# Patient Record
Sex: Male | Born: 1935 | Race: White | Hispanic: No | Marital: Married | Smoking: Former smoker
Health system: Southern US, Community
[De-identification: ages and names within clinical notes are randomized; demographics above are authoritative.]

## PROBLEM LIST (undated history)

## (undated) DIAGNOSIS — N400 Enlarged prostate without lower urinary tract symptoms: Secondary | ICD-10-CM

## (undated) DIAGNOSIS — Z9181 History of falling: Secondary | ICD-10-CM

## (undated) DIAGNOSIS — I739 Peripheral vascular disease, unspecified: Secondary | ICD-10-CM

## (undated) DIAGNOSIS — G629 Polyneuropathy, unspecified: Secondary | ICD-10-CM

## (undated) DIAGNOSIS — E876 Hypokalemia: Secondary | ICD-10-CM

## (undated) DIAGNOSIS — T8859XA Other complications of anesthesia, initial encounter: Secondary | ICD-10-CM

## (undated) DIAGNOSIS — R32 Unspecified urinary incontinence: Secondary | ICD-10-CM

## (undated) DIAGNOSIS — G319 Degenerative disease of nervous system, unspecified: Secondary | ICD-10-CM

## (undated) DIAGNOSIS — M1710 Unilateral primary osteoarthritis, unspecified knee: Secondary | ICD-10-CM

## (undated) DIAGNOSIS — D649 Anemia, unspecified: Secondary | ICD-10-CM

## (undated) DIAGNOSIS — M159 Polyosteoarthritis, unspecified: Secondary | ICD-10-CM

## (undated) DIAGNOSIS — M419 Scoliosis, unspecified: Secondary | ICD-10-CM

## (undated) DIAGNOSIS — K5790 Diverticulosis of intestine, part unspecified, without perforation or abscess without bleeding: Secondary | ICD-10-CM

## (undated) DIAGNOSIS — I517 Cardiomegaly: Secondary | ICD-10-CM

## (undated) DIAGNOSIS — IMO0001 Reserved for inherently not codable concepts without codable children: Secondary | ICD-10-CM

## (undated) DIAGNOSIS — G309 Alzheimer's disease, unspecified: Principal | ICD-10-CM

## (undated) DIAGNOSIS — R2689 Other abnormalities of gait and mobility: Secondary | ICD-10-CM

## (undated) DIAGNOSIS — E785 Hyperlipidemia, unspecified: Secondary | ICD-10-CM

## (undated) DIAGNOSIS — F0280 Dementia in other diseases classified elsewhere without behavioral disturbance: Secondary | ICD-10-CM

## (undated) DIAGNOSIS — D696 Thrombocytopenia, unspecified: Secondary | ICD-10-CM

## (undated) DIAGNOSIS — I712 Thoracic aortic aneurysm, without rupture, unspecified: Secondary | ICD-10-CM

## (undated) DIAGNOSIS — I679 Cerebrovascular disease, unspecified: Secondary | ICD-10-CM

## (undated) DIAGNOSIS — E871 Hypo-osmolality and hyponatremia: Secondary | ICD-10-CM

## (undated) DIAGNOSIS — K219 Gastro-esophageal reflux disease without esophagitis: Secondary | ICD-10-CM

## (undated) DIAGNOSIS — T4145XA Adverse effect of unspecified anesthetic, initial encounter: Secondary | ICD-10-CM

## (undated) DIAGNOSIS — I499 Cardiac arrhythmia, unspecified: Secondary | ICD-10-CM

## (undated) DIAGNOSIS — J189 Pneumonia, unspecified organism: Secondary | ICD-10-CM

## (undated) DIAGNOSIS — K922 Gastrointestinal hemorrhage, unspecified: Secondary | ICD-10-CM

## (undated) DIAGNOSIS — E46 Unspecified protein-calorie malnutrition: Secondary | ICD-10-CM

## (undated) DIAGNOSIS — H9190 Unspecified hearing loss, unspecified ear: Secondary | ICD-10-CM

## (undated) DIAGNOSIS — I4891 Unspecified atrial fibrillation: Secondary | ICD-10-CM

## (undated) HISTORY — DX: Scoliosis, unspecified: M41.9

## (undated) HISTORY — DX: Cerebrovascular disease, unspecified: I67.9

## (undated) HISTORY — DX: History of falling: Z91.81

## (undated) HISTORY — PX: JOINT REPLACEMENT: SHX530

## (undated) HISTORY — DX: Thrombocytopenia, unspecified: D69.6

## (undated) HISTORY — DX: Hypokalemia: E87.6

## (undated) HISTORY — DX: Degenerative disease of nervous system, unspecified: G31.9

## (undated) HISTORY — DX: Unilateral primary osteoarthritis, unspecified knee: M17.10

## (undated) HISTORY — DX: Thoracic aortic aneurysm, without rupture: I71.2

## (undated) HISTORY — PX: HERNIA REPAIR: SHX51

## (undated) HISTORY — DX: Hypo-osmolality and hyponatremia: E87.1

## (undated) HISTORY — PX: OTHER SURGICAL HISTORY: SHX169

## (undated) HISTORY — DX: Thoracic aortic aneurysm, without rupture, unspecified: I71.20

## (undated) HISTORY — DX: Alzheimer's disease, unspecified: G30.9

## (undated) HISTORY — DX: Gastro-esophageal reflux disease without esophagitis: K21.9

## (undated) HISTORY — DX: Benign prostatic hyperplasia without lower urinary tract symptoms: N40.0

## (undated) HISTORY — DX: Polyosteoarthritis, unspecified: M15.9

## (undated) HISTORY — DX: Unspecified protein-calorie malnutrition: E46

## (undated) HISTORY — DX: Hyperlipidemia, unspecified: E78.5

## (undated) HISTORY — DX: Unspecified atrial fibrillation: I48.91

## (undated) HISTORY — DX: Cardiomegaly: I51.7

## (undated) HISTORY — DX: Dementia in other diseases classified elsewhere, unspecified severity, without behavioral disturbance, psychotic disturbance, mood disturbance, and anxiety: F02.80

---
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
## 1998-02-10 ENCOUNTER — Ambulatory Visit (HOSPITAL_COMMUNITY): Admission: RE | Admit: 1998-02-10 | Discharge: 1998-02-10 | Payer: Self-pay | Admitting: Internal Medicine

## 2006-08-09 HISTORY — PX: OTHER SURGICAL HISTORY: SHX169

## 2006-08-09 HISTORY — PX: VIDEO ASSISTED THORACOSCOPY (VATS)/DECORTICATION: SHX6171

## 2006-12-04 ENCOUNTER — Ambulatory Visit: Payer: Self-pay | Admitting: Cardiology

## 2006-12-04 ENCOUNTER — Inpatient Hospital Stay (HOSPITAL_COMMUNITY): Admission: EM | Admit: 2006-12-04 | Discharge: 2006-12-09 | Payer: Self-pay | Admitting: Emergency Medicine

## 2006-12-05 ENCOUNTER — Ambulatory Visit: Payer: Self-pay | Admitting: Cardiology

## 2006-12-05 ENCOUNTER — Encounter: Payer: Self-pay | Admitting: Cardiology

## 2006-12-15 ENCOUNTER — Encounter: Payer: Self-pay | Admitting: Emergency Medicine

## 2006-12-16 ENCOUNTER — Ambulatory Visit: Payer: Self-pay | Admitting: Pulmonary Disease

## 2006-12-16 ENCOUNTER — Encounter (INDEPENDENT_AMBULATORY_CARE_PROVIDER_SITE_OTHER): Payer: Self-pay | Admitting: Specialist

## 2006-12-16 ENCOUNTER — Inpatient Hospital Stay (HOSPITAL_COMMUNITY): Admission: EM | Admit: 2006-12-16 | Discharge: 2007-01-04 | Payer: Self-pay | Admitting: Internal Medicine

## 2006-12-16 ENCOUNTER — Ambulatory Visit: Payer: Self-pay | Admitting: Cardiothoracic Surgery

## 2006-12-16 ENCOUNTER — Ambulatory Visit: Payer: Self-pay | Admitting: Internal Medicine

## 2006-12-23 ENCOUNTER — Ambulatory Visit: Payer: Self-pay | Admitting: Physical Medicine & Rehabilitation

## 2007-01-03 ENCOUNTER — Ambulatory Visit: Payer: Self-pay | Admitting: Gastroenterology

## 2007-01-04 ENCOUNTER — Inpatient Hospital Stay (HOSPITAL_COMMUNITY)
Admission: RE | Admit: 2007-01-04 | Discharge: 2007-01-17 | Payer: Self-pay | Admitting: Physical Medicine & Rehabilitation

## 2007-02-02 ENCOUNTER — Ambulatory Visit (HOSPITAL_COMMUNITY)
Admission: RE | Admit: 2007-02-02 | Discharge: 2007-02-02 | Payer: Self-pay | Admitting: Physical Medicine & Rehabilitation

## 2007-02-02 ENCOUNTER — Ambulatory Visit
Admission: RE | Admit: 2007-02-02 | Discharge: 2007-02-02 | Payer: Self-pay | Admitting: Physical Medicine & Rehabilitation

## 2007-03-01 ENCOUNTER — Encounter: Admission: RE | Admit: 2007-03-01 | Discharge: 2007-03-28 | Payer: Self-pay | Admitting: Internal Medicine

## 2007-03-20 ENCOUNTER — Ambulatory Visit: Payer: Self-pay | Admitting: Gastroenterology

## 2007-03-29 ENCOUNTER — Ambulatory Visit: Payer: Self-pay | Admitting: Gastroenterology

## 2007-04-18 ENCOUNTER — Ambulatory Visit: Payer: Self-pay | Admitting: Gastroenterology

## 2007-10-26 DIAGNOSIS — K4090 Unilateral inguinal hernia, without obstruction or gangrene, not specified as recurrent: Secondary | ICD-10-CM | POA: Insufficient documentation

## 2007-10-26 DIAGNOSIS — M159 Polyosteoarthritis, unspecified: Secondary | ICD-10-CM

## 2007-10-26 DIAGNOSIS — I4891 Unspecified atrial fibrillation: Secondary | ICD-10-CM | POA: Insufficient documentation

## 2007-10-26 DIAGNOSIS — J869 Pyothorax without fistula: Secondary | ICD-10-CM | POA: Insufficient documentation

## 2007-10-26 DIAGNOSIS — B965 Pseudomonas (aeruginosa) (mallei) (pseudomallei) as the cause of diseases classified elsewhere: Secondary | ICD-10-CM

## 2007-10-26 HISTORY — DX: Polyosteoarthritis, unspecified: M15.9

## 2008-10-21 IMAGING — CR DG CHEST 1V PORT
1 series · 1 of 1 positions shown · non-contrast
Comparison: None

CLINICAL DATA: Atrial fibrillation. Chest pain.

PORTABLE CHEST - 1 VIEW

[view not recorded]
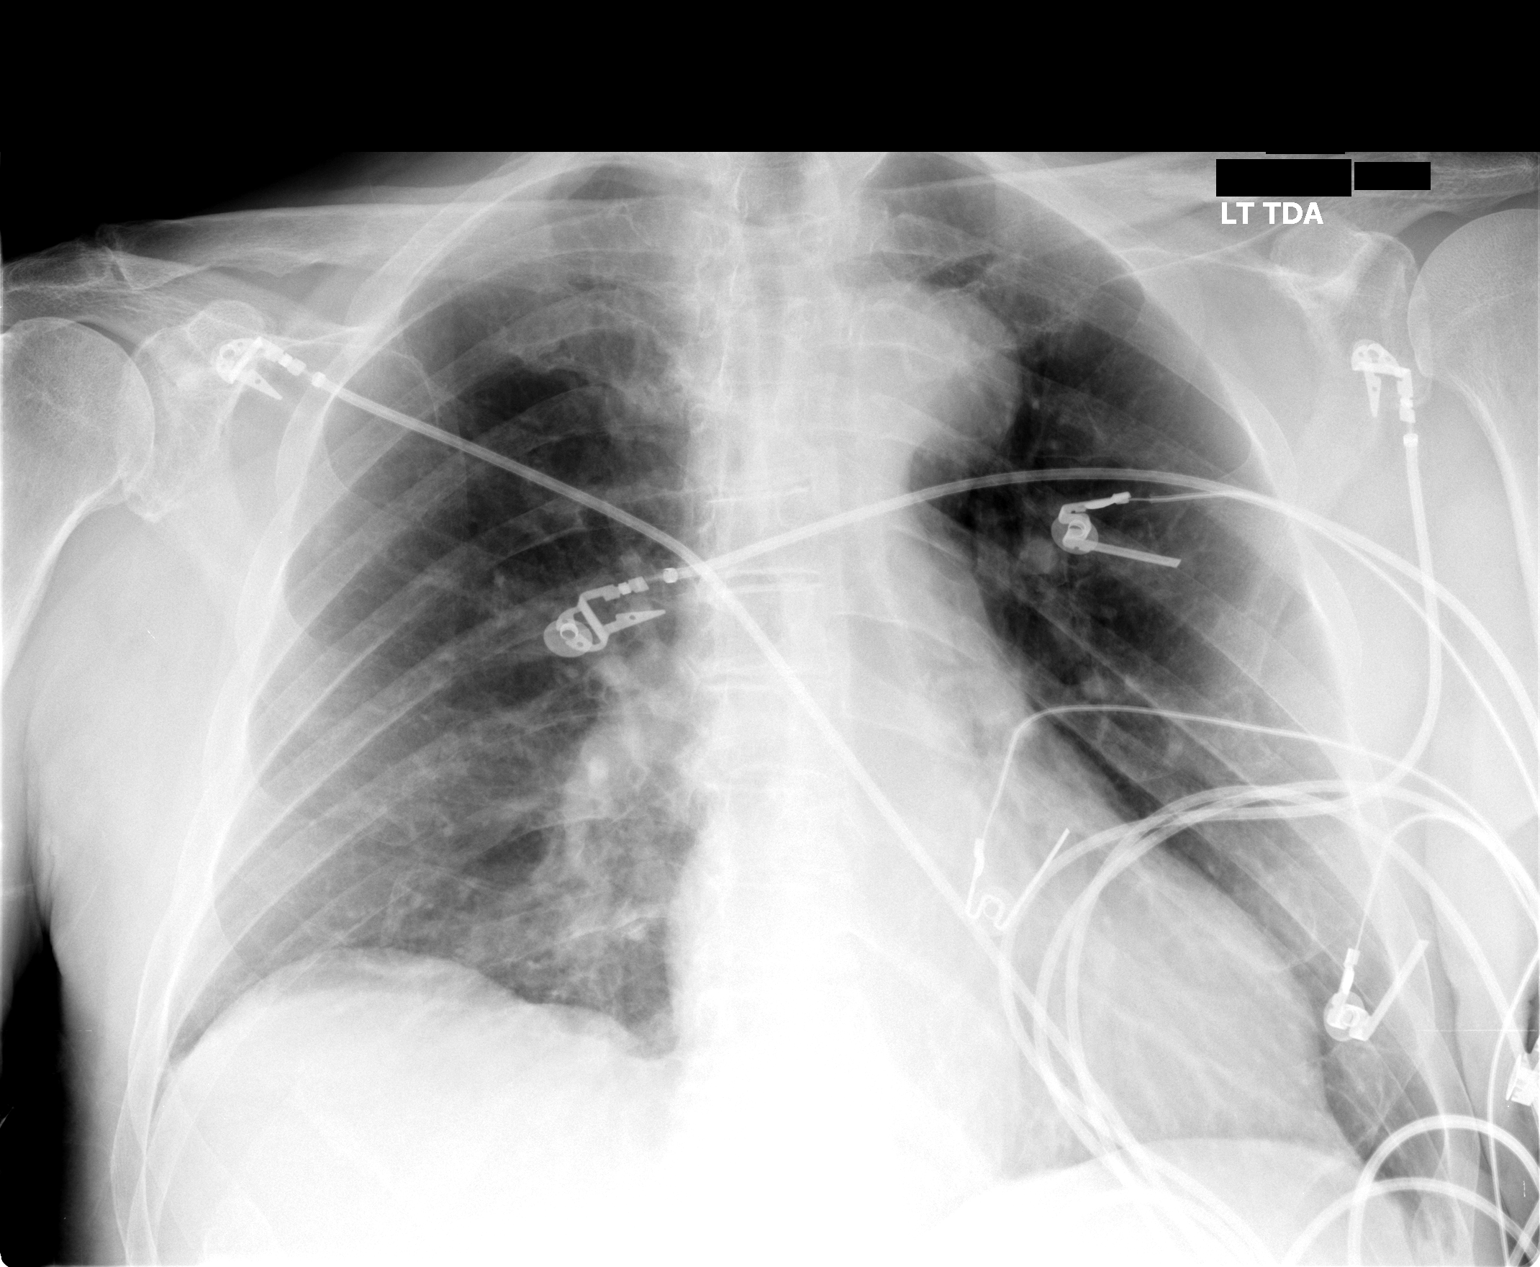

[1 of 1 positions shown; findings below may reference images not displayed]

FINDINGS: The aortic arch is high in position, although does not qualify as a
"cervical aortic arch."

Heart size is within normal limits. The lungs appear clear. No edema is noted.  

IMPRESSION

No acute thoracic findings.

## 2008-10-23 IMAGING — CT CT HEAD WO/W CM
3 series · 18 of 30 positions shown, 20 images · IV contrast (100 ML OMNI 300)
Comparison: None.

CLINICAL DATA: Atrial fibrillation. Confusion. 
 HEAD CT WITHOUT AND WITH CONTRAST:
TECHNIQUE: Contiguous axial images were obtained from the base of the skull through the vertex according to standard protocol before and after administration of intravenous contrast.
 Contrast:  100 ml Omnipaque 300

[Series 2: brain · axial · 0.47mm/px · z∈[+153,+279]mm · 8 of 32 slices shown, 10 images (1 of 2)]
[im 4/32  brain]
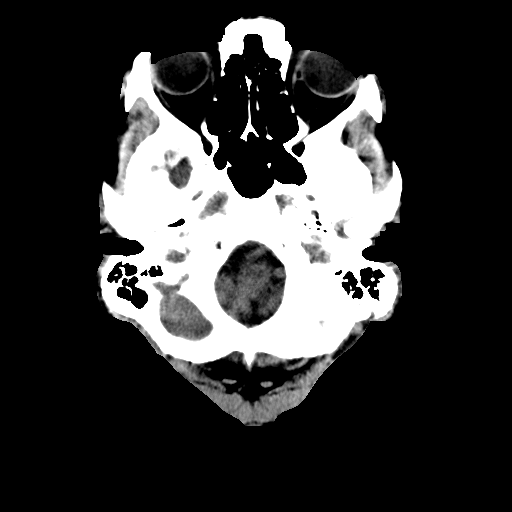
[im 4/32  bone]
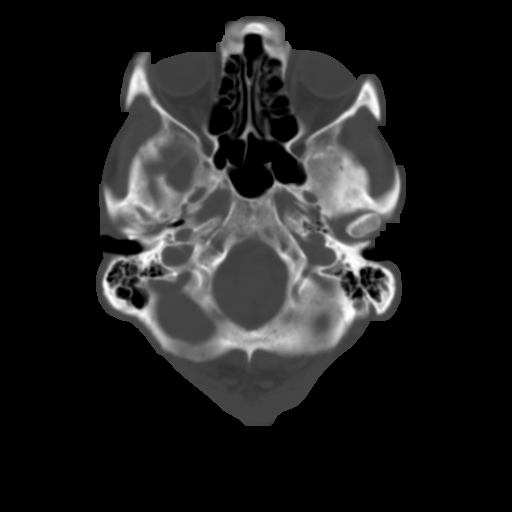
[im 7/32  brain]
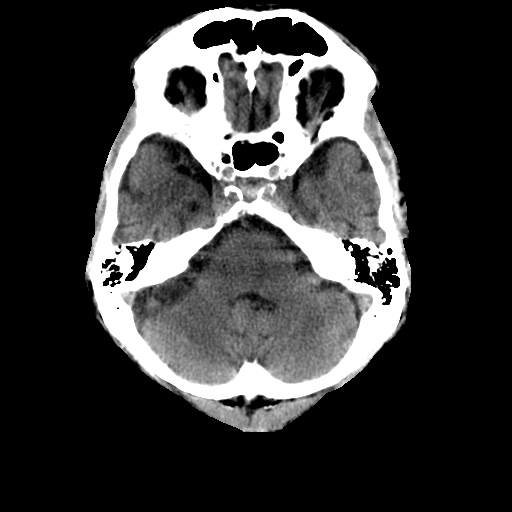
[im 11/32  brain]
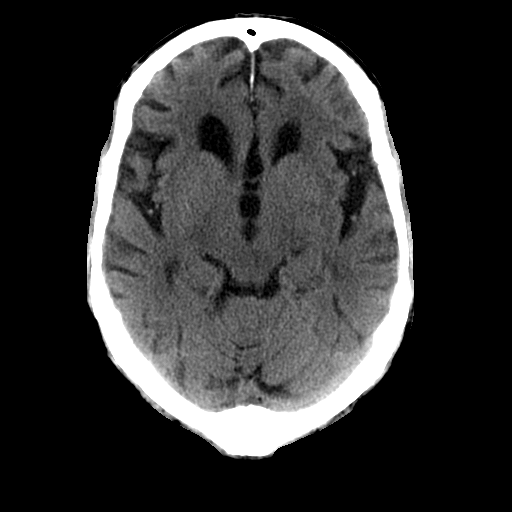
[im 14/32  brain]
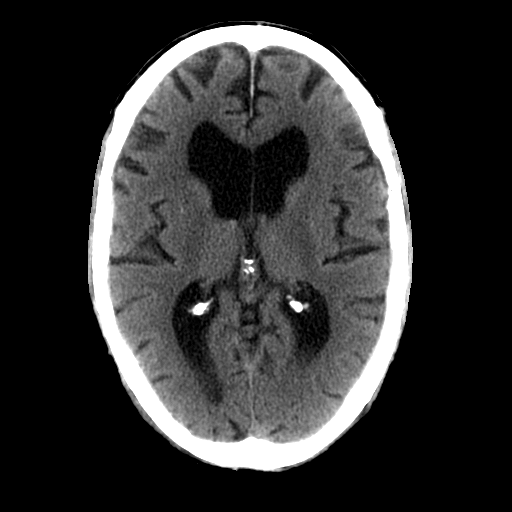
[im 18/32  brain]
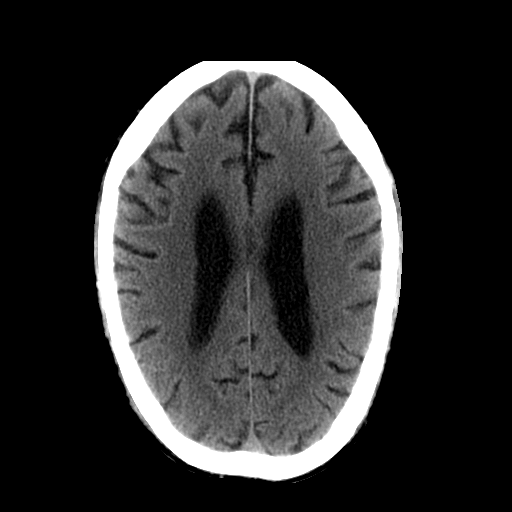
[im 18/32  bone]
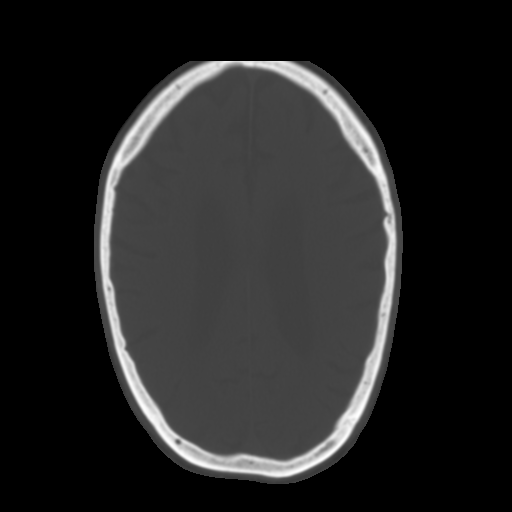
[im 21/32  brain]
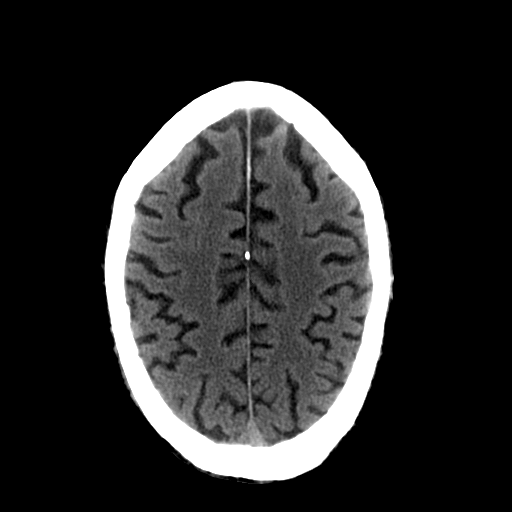
[im 25/32  brain]
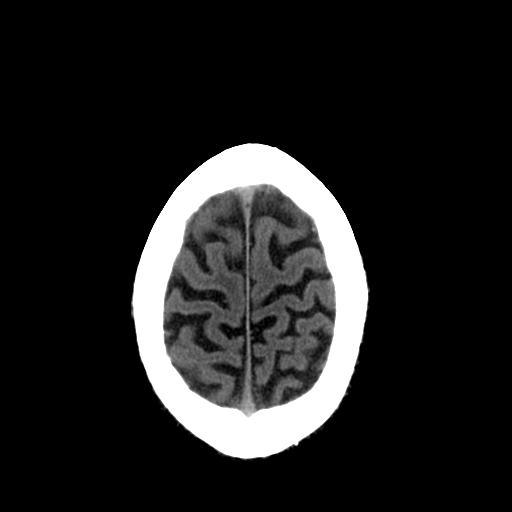
[im 28/32  brain]
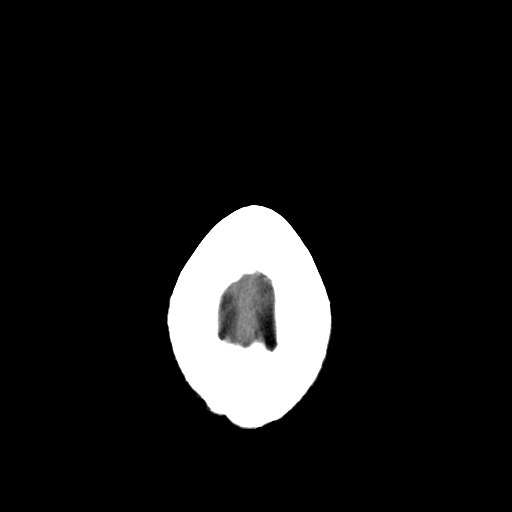

[Series 3: brain · axial · 0.47mm/px · z∈[+153,+279]mm · 8 of 32 slices shown (2 of 2)]
[im 4/32  brain]
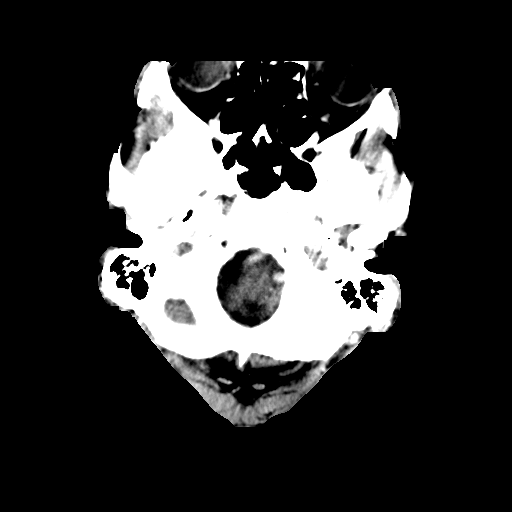
[im 7/32  brain]
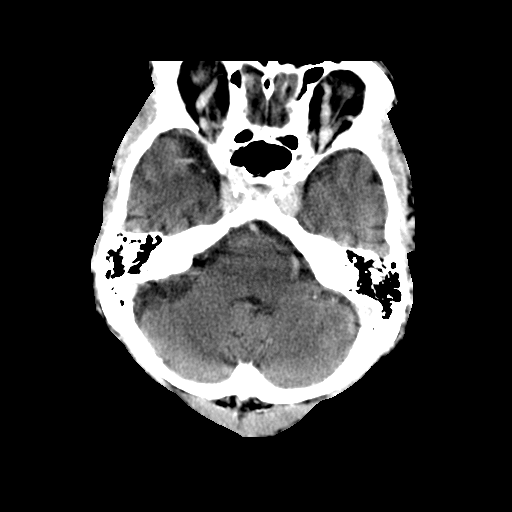
[im 11/32  brain]
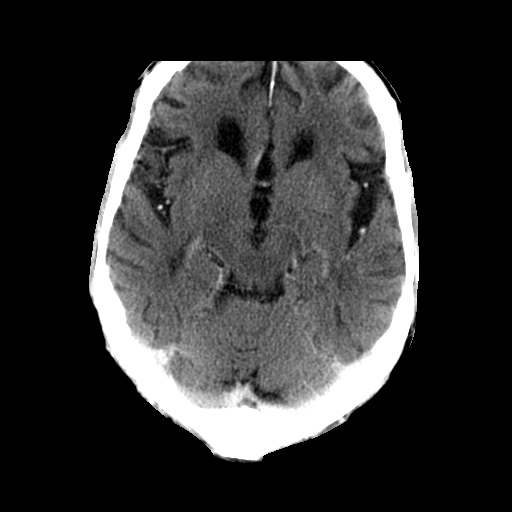
[im 14/32  brain]
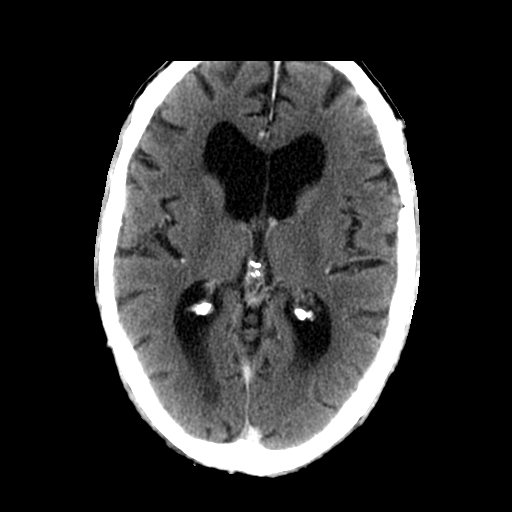
[im 18/32  brain]
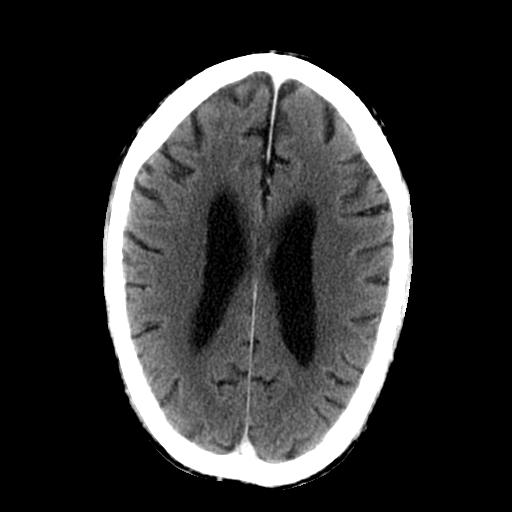
[im 21/32  brain]
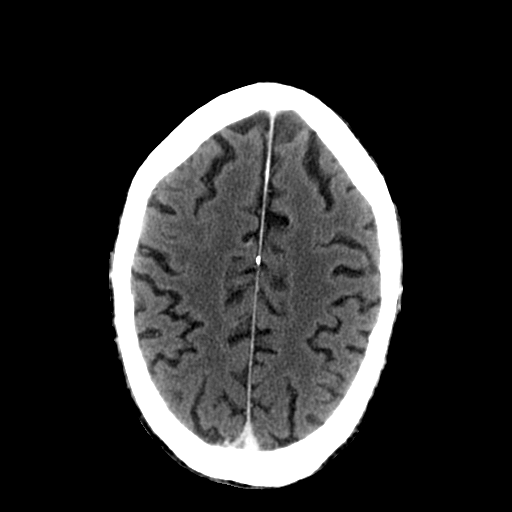
[im 25/32  brain]
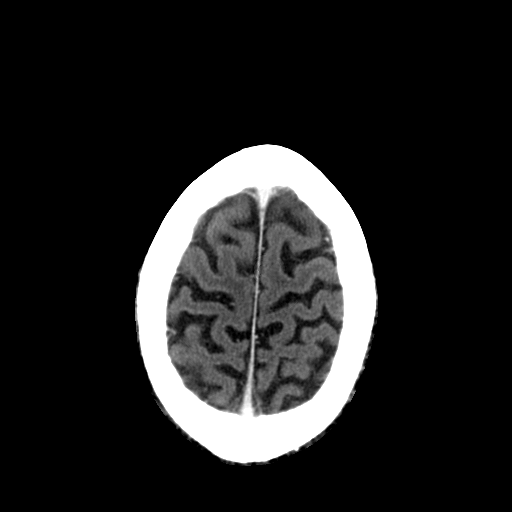
[im 28/32  brain]
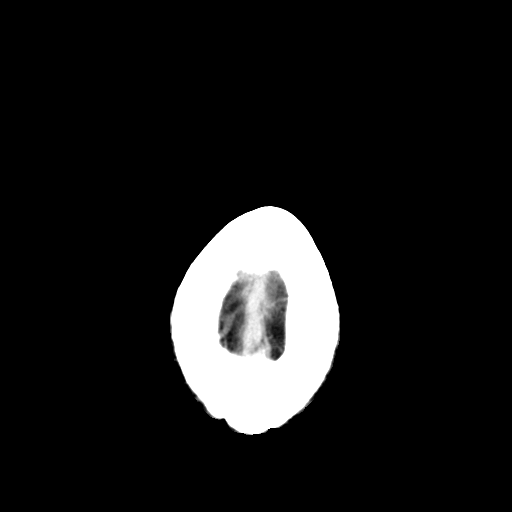

[Series 4: recon 2: brain · axial · 0.47mm/px · z∈[+153,+169]mm · 2 of 32 slices shown]
[im 4/32  brain]
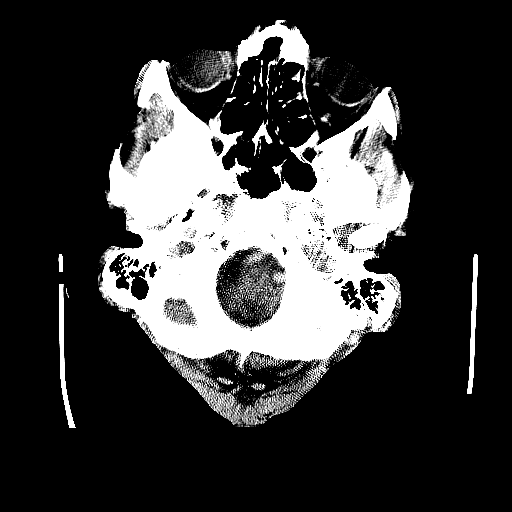
[im 7/32  brain]
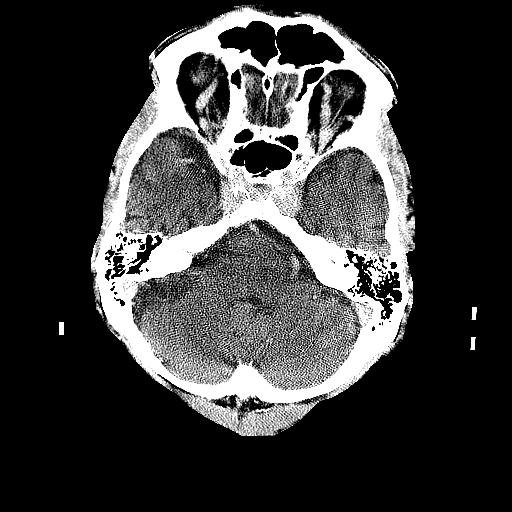

[18 of 30 positions shown; findings below may reference images not displayed]

FINDINGS: There is generalized atrophy.  There is no hemorrhage, mass, or acute infarct.  Enhancement pattern is normal and there is no mass lesion.
IMPRESSION: Generalized atrophy. No acute abnormality.

## 2010-08-09 DIAGNOSIS — K922 Gastrointestinal hemorrhage, unspecified: Secondary | ICD-10-CM

## 2010-08-09 HISTORY — DX: Gastrointestinal hemorrhage, unspecified: K92.2

## 2010-08-30 ENCOUNTER — Encounter: Payer: Self-pay | Admitting: Physical Medicine & Rehabilitation

## 2010-09-29 ENCOUNTER — Other Ambulatory Visit: Payer: Self-pay | Admitting: Orthopedic Surgery

## 2010-09-29 ENCOUNTER — Encounter (HOSPITAL_COMMUNITY): Payer: Medicare Other

## 2010-09-29 ENCOUNTER — Other Ambulatory Visit (HOSPITAL_COMMUNITY): Payer: Self-pay | Admitting: Orthopedic Surgery

## 2010-09-29 ENCOUNTER — Ambulatory Visit (HOSPITAL_COMMUNITY)
Admission: RE | Admit: 2010-09-29 | Discharge: 2010-09-29 | Disposition: A | Discharge status: Home / Self Care | Payer: Medicare Other | Source: Ambulatory Visit | Attending: Orthopedic Surgery | Admitting: Orthopedic Surgery

## 2010-09-29 DIAGNOSIS — Z01818 Encounter for other preprocedural examination: Secondary | ICD-10-CM | POA: Insufficient documentation

## 2010-09-29 DIAGNOSIS — M1710 Unilateral primary osteoarthritis, unspecified knee: Secondary | ICD-10-CM

## 2010-09-29 DIAGNOSIS — Z01812 Encounter for preprocedural laboratory examination: Secondary | ICD-10-CM | POA: Insufficient documentation

## 2010-09-29 LAB — URINALYSIS, ROUTINE W REFLEX MICROSCOPIC
Bilirubin Urine: NEGATIVE
Hgb urine dipstick: NEGATIVE
Nitrite: NEGATIVE
Protein, ur: NEGATIVE mg/dL
Specific Gravity, Urine: 1.018 (ref 1.005–1.030)
Urine Glucose, Fasting: NEGATIVE mg/dL
Urobilinogen, UA: 1 mg/dL (ref 0.0–1.0)
pH: 6.5 (ref 5.0–8.0)

## 2010-09-29 LAB — CBC
HCT: 39.8 % (ref 39.0–52.0)
Hemoglobin: 12.9 g/dL — ABNORMAL LOW (ref 13.0–17.0)
MCH: 30.7 pg (ref 26.0–34.0)
MCHC: 32.4 g/dL (ref 30.0–36.0)
MCV: 94.8 fL (ref 78.0–100.0)
Platelets: 193 K/uL (ref 150–400)
RBC: 4.2 MIL/uL — ABNORMAL LOW (ref 4.22–5.81)
RDW: 12.8 % (ref 11.5–15.5)
WBC: 8.3 K/uL (ref 4.0–10.5)

## 2010-09-29 LAB — COMPREHENSIVE METABOLIC PANEL WITH GFR
ALT: 16 U/L (ref 0–53)
AST: 30 U/L (ref 0–37)
Albumin: 3.6 g/dL (ref 3.5–5.2)
Alkaline Phosphatase: 70 U/L (ref 39–117)
BUN: 21 mg/dL (ref 6–23)
CO2: 28 meq/L (ref 19–32)
Calcium: 9.4 mg/dL (ref 8.4–10.5)
Chloride: 105 meq/L (ref 96–112)
Creatinine, Ser: 0.97 mg/dL (ref 0.4–1.5)
GFR calc non Af Amer: >60 mL/min
Glucose, Bld: 96 mg/dL (ref 70–99)
Potassium: 4.8 meq/L (ref 3.5–5.1)
Sodium: 140 meq/L (ref 135–145)
Total Bilirubin: 0.9 mg/dL (ref 0.3–1.2)
Total Protein: 6.3 g/dL (ref 6.0–8.3)

## 2010-09-29 LAB — APTT

## 2010-10-05 ENCOUNTER — Inpatient Hospital Stay (HOSPITAL_COMMUNITY)
Admission: RE | Admit: 2010-10-05 | Discharge: 2010-10-09 | DRG: 470 | Disposition: A | Discharge status: Skilled Nursing Facility | Payer: Medicare Other | Source: Ambulatory Visit | Attending: Orthopedic Surgery | Admitting: Orthopedic Surgery

## 2010-10-05 DIAGNOSIS — E876 Hypokalemia: Secondary | ICD-10-CM | POA: Diagnosis not present

## 2010-10-05 DIAGNOSIS — E785 Hyperlipidemia, unspecified: Secondary | ICD-10-CM | POA: Diagnosis present

## 2010-10-05 DIAGNOSIS — H9190 Unspecified hearing loss, unspecified ear: Secondary | ICD-10-CM | POA: Diagnosis present

## 2010-10-05 DIAGNOSIS — D62 Acute posthemorrhagic anemia: Secondary | ICD-10-CM | POA: Diagnosis not present

## 2010-10-05 DIAGNOSIS — E871 Hypo-osmolality and hyponatremia: Secondary | ICD-10-CM | POA: Diagnosis not present

## 2010-10-05 DIAGNOSIS — M4120 Other idiopathic scoliosis, site unspecified: Secondary | ICD-10-CM | POA: Diagnosis present

## 2010-10-05 DIAGNOSIS — I4891 Unspecified atrial fibrillation: Secondary | ICD-10-CM | POA: Diagnosis present

## 2010-10-05 DIAGNOSIS — E039 Hypothyroidism, unspecified: Secondary | ICD-10-CM | POA: Diagnosis present

## 2010-10-05 DIAGNOSIS — G589 Mononeuropathy, unspecified: Secondary | ICD-10-CM | POA: Diagnosis present

## 2010-10-05 DIAGNOSIS — R351 Nocturia: Secondary | ICD-10-CM | POA: Diagnosis present

## 2010-10-05 DIAGNOSIS — K219 Gastro-esophageal reflux disease without esophagitis: Secondary | ICD-10-CM | POA: Diagnosis present

## 2010-10-05 DIAGNOSIS — M1710 Unilateral primary osteoarthritis, unspecified knee: Principal | ICD-10-CM | POA: Diagnosis present

## 2010-10-05 DIAGNOSIS — N400 Enlarged prostate without lower urinary tract symptoms: Secondary | ICD-10-CM | POA: Diagnosis present

## 2010-10-05 HISTORY — PX: TOTAL KNEE ARTHROPLASTY: SHX125

## 2010-10-05 LAB — TYPE AND SCREEN: Antibody Screen: NEGATIVE

## 2010-10-06 LAB — CBC
Hemoglobin: 9.5 g/dL — ABNORMAL LOW (ref 13.0–17.0)
MCH: 31.1 pg (ref 26.0–34.0)
RBC: 3.05 MIL/uL — ABNORMAL LOW (ref 4.22–5.81)
WBC: 9.5 10*3/uL (ref 4.0–10.5)

## 2010-10-06 LAB — BASIC METABOLIC PANEL
BUN: 11 mg/dL (ref 6–23)
Chloride: 99 mEq/L (ref 96–112)
Creatinine, Ser: 0.83 mg/dL (ref 0.4–1.5)
GFR calc non Af Amer: >60 mL/min (ref >60)

## 2010-10-06 LAB — PROTIME-INR: Prothrombin Time: 17.7 seconds — ABNORMAL HIGH (ref 11.6–15.2)

## 2010-10-07 LAB — PROTIME-INR: Prothrombin Time: 18.8 seconds — ABNORMAL HIGH (ref 11.6–15.2)

## 2010-10-07 LAB — CBC
Hemoglobin: 9.7 g/dL — ABNORMAL LOW (ref 13.0–17.0)
MCH: 31 pg (ref 26.0–34.0)
MCHC: 33.4 g/dL (ref 30.0–36.0)

## 2010-10-07 LAB — BASIC METABOLIC PANEL
BUN: 14 mg/dL (ref 6–23)
GFR calc non Af Amer: >60 mL/min (ref >60)
Glucose, Bld: 121 mg/dL — ABNORMAL HIGH (ref 70–99)
Potassium: 3.5 mEq/L (ref 3.5–5.1)

## 2010-10-08 LAB — CBC
HCT: 26.1 % — ABNORMAL LOW (ref 39.0–52.0)
MCH: 31.1 pg (ref 26.0–34.0)
MCHC: 33.7 g/dL (ref 30.0–36.0)
MCV: 92.2 fL (ref 78.0–100.0)
RDW: 12.5 % (ref 11.5–15.5)

## 2010-10-08 LAB — PROTIME-INR: INR: 1.44 (ref 0.00–1.49)

## 2010-10-15 NOTE — Op Note (Signed)
NAMEELBRIDGE, MAGOWAN                ACCOUNT NO.:  [id number]  MEDICAL RECORD NO.:  [id number]           PATIENT TYPE:  I  LOCATION:  0004                         FACILITY:  [facility]  PHYSICIAN:  Ollen Gross, M.D.    DATE OF BIRTH:  [birth year]  DATE OF PROCEDURE:  10/05/2010 DATE OF DISCHARGE:  09/29/2010                              OPERATIVE REPORT   PREOPERATIVE DIAGNOSIS:  Osteoarthritis, right knee.  POSTOPERATIVE DIAGNOSIS:  Osteoarthritis, right knee.  PROCEDURE:  Right total knee arthroplasty.  SURGEON:  Ollen Gross, M.D.  ASSISTANT:  Rozell Searing, [facility]  ANESTHESIA:  Spinal converted to general.  ESTIMATED BLOOD LOSS:  Minimal.  DRAIN:  Hemovac x1.  TOURNIQUET TIME:  45 minutes at 300 mmHg.  COMPLICATIONS:  None.  CONDITION.:  Stable to recovery room.  BRIEF CLINICAL NOTE:  Mr. Speciale is a 77 year old male with severe end- stage arthritis of the right knee with progressive worsening pain and dysfunction.  He is an avid exerciser, but it has been limiting his ability to exercise.  He has failed nonoperative management and presents now for right total knee arthroplasty.  PROCEDURE IN DETAIL:  After successful administration of spinal anesthetic, a tourniquet was placed high on the right thigh and right lower extremity prepped and draped in the usual sterile fashion. Extremity was wrapped in Esmarch, knee flexed, tourniquet inflated to 300 mmHg.  Midline incision was made with a 10 blade through the subcutaneous tissue to the level of the extensor mechanism.  Fresh blade was used to make a medial parapatellar arthrotomy.  I did not disrupt the soft tissue sleeve medially as he had large valgus deformity.  I did a lateral subperiosteal elevation of tissue with attention being paid to avoiding the patellar tendon on tibial tubercle.  The patella was everted, knee flexed 90 degrees.  The patient started to feel it at this point.  He was complaining of pain, thus  he was converted to a general anesthetic.  Once the general was administered, then we took out the ACL and PCL.  Drill was used to create a starting hole in the distal femur. Canal was thoroughly irrigated.  A 5-degree right valgus alignment guide was placed and the distal femoral cutting block was pinned to remove 12 mm off the distal femur.  It was 12 because of a large preop flexion contracture.  Distal femoral resection was made with an oscillating saw. A sizing block was placed, size 4 was most appropriate.  Rotations marked off the epicondylar axis.  Size 4 cutting block was placed and the anterior, posterior and chamfer cuts made.  Tibia subluxed forward and the menisci were removed.  Extramedullary tibial alignment guide was placed referencing proximally at the medial aspect of the tibial tubercle and distally along the second metatarsal axis and tibial crest.  Block was pinned to remove 2 mm off the more deficient lateral side.  Tibial resection was made with an oscillating saw.  Size 4 was the most appropriate tibial component and the proximal tibia was prepared with the modular drill and keel punched for the size 4.  Femoral preparation  was completed with the intercondylar cut.  Size 4 mobile bearing tibial trial, size 4 posterior stabilized femoral trial and 12.5-mm posterior stabilized rotating platform insert trial was placed.  With the 12.5, we obtained about 10 degrees of full extension.  I then cleaned the osteophytes off the back of the femur. We replaced the trial, and we were able to achieve full extension. There was excellent varus-valgus and anterior-posterior balance throughout with full range of motion.  The patella was everted, thickness measured to be 25 mm.  Freehand resection was taken to 15 mm, 38 template was placed, lug holes were drilled, trial patella was placed and it tracked normally.  Osteophytes were already removed.  Trials were removed and the cut  bone surfaces prepared with pulsatile lavage. Cement was mixed and once ready for implantation., a size 4 mobile bearing tibial tray, size 4 posterior stabilized femur and 38 patella were cemented into place.  Patella was held with a clamp.  Trial 12.5-mm insert was placed and the knee was held in full extension.  Once the cement was fully hardened and the trials were removed, then the permanent 12.5-mm posterior stabilized rotating platform insert was placed in the tibial tray.  Wound was then copiously irrigated with saline solution and the arthrotomy closed over Hemovac drain with interrupted #1 PDS.  Flexion against gravity was 140 degrees.  Patella tracked normally.  The tourniquet was then released with total time of 45 minutes.  Subcu was closed with interrupted 2-0 Vicryl and subcuticular running 4-0 Monocryl.  The catheter for the Marcaine pain pump was placed and pump was initiated.  Incision was cleaned and dried and a bulky sterile dressing applied.  He was then placed into a knee immobilizer, awakened and transported to recovery in stable condition.     Ollen Gross, M.D.     FA/MEDQ  D:  10/05/2010  T:  10/05/2010  Job:  [id number]  Electronically Signed by Ollen Gross M.D. on 10/14/2010 03:46:46 PM

## 2010-10-15 NOTE — H&P (Signed)
[name], [name]                ACCOUNT NO.:  [id number]  MEDICAL RECORD NO.:  [id number]           PATIENT TYPE:  I  LOCATION:  1610                         FACILITY:  [facility]  PHYSICIAN:  Ollen Gross, M.D.    DATE OF BIRTH:  02-17-1918  DATE OF ADMISSION:  10/05/2010 DATE OF DISCHARGE:  10/09/2010                             HISTORY & PHYSICAL   CHIEF COMPLAINT:  Right knee pain.  HISTORY OF PRESENT ILLNESS:  The patient is a 78 year old male who has been seen by Dr. Lequita Halt for ongoing right knee pain.  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  He has had problems with his right knee more so than his left knee for many years now.  He stopped playing tennis about 7 or 8 years ago.  Because of the ongoing pain, he has been seen in the office and x-rays show severe end-stage arthritis of both knees with the right being worse.  It is felt he would benefit from undergoing surgical intervention.  Risks and benefits have been discussed.  He elects to proceed with surgery.  ALLERGIES:  No known drug allergies.  CURRENT MEDICATIONS:  Flurbiprofen.  PAST MEDICAL HISTORY: 1. Hyperlipidemia. 2. Past history of septic right lobe empyema. 3. Past history of an episode of atrial fibrillation. 4. Hypothyroidism. 5. Scoliosis. 6. Peripheral neuropathy. 7. Hearing loss. 8. BPH. 9. Right eye cataract. 10.Gastroesophageal reflux disease. 11.Mild nocturia. 12.Hypertension.  PAST SURGICAL HISTORY:  Prostate biopsy, right eye lens implant, video- assisted thorascopic surgery secondary to bacterial empyema, tonsillectomy, bilateral inguinal hernia repair, and also bilateral cataracts.  FAMILY HISTORY:  Mother deceased at age 70.  Father deceased, lived to be 95.  SOCIAL HISTORY:  Retired Education officer, community.  Married.  Nonsmoker.  No alcohol.  REVIEW OF SYSTEMS:  GENERAL:  No fever, chills, or night sweats.  NEURO: No seizure, syncope, or paralysis.  RESPIRATORY:  No shortness breath, productive  cough, or hemoptysis.  CARDIOVASCULAR:  No chest pain or orthopnea.  GI:  No nausea, vomiting, diarrhea, or constipation.  GU: No dysuria, hematuria, or discharge.  MUSCULOSKELETAL:  Right knee.  PHYSICAL EXAMINATION:  VITAL SIGNS:  Pulse 68, respirations 12, blood pressure 114/72. GENERAL:  A 77 year old white male, well nourished, well developed, tall and slender frame, alert, oriented, and cooperative. HEENT:  Normocephalic, atraumatic.  Pupils are round and reactive.  EOMs intact. NECK:  Supple.  No carotid bruits. CHEST:  Clear anterior and posterior chest walls.  No rhonchi, rales, or wheezing. HEART:  Regular rhythm without murmur, S1 and S2 noted. ABDOMEN:  Soft, flat, nontender.  Bowel sounds present. RECTAL:  Not done, not pertinent to present illness. BREASTS:  Not done, not pertinent to present illness. GENITALIA:  Not done, not pertinent to present illness. EXTREMITIES:  Right knee, significant valgus malalignment deformity.  No instability.  Range of motion 5-150.  Marked crepitus.  Tender more medial than lateral.  IMPRESSION:  Osteoarthritis, right knee, with significant valgus deformity.  PLAN:  The patient admitted to [facility] to undergo a right total knee replacement arthroplasty.  Surgery will be performed by Dr. Ollen Gross.  The patient has been seen by Dr. Timothy Lasso and felt to be stable for the upcoming procedure.     [name], P.A.C.   ______________________________ Ollen Gross, M.D.    ALP/MEDQ  D:  10/11/2010  T:  10/12/2010  Job:  [id number]  cc:   Gwen Pounds, MD Fax: [phone]  Electronically Signed by Patrica Duel P.A.C. on 10/12/2010 10:47:45 AM Electronically Signed by Ollen Gross M.D. on 10/14/2010 03:46:50 PM

## 2010-11-02 NOTE — Discharge Summary (Signed)
[name], [name]                ACCOUNT NO.:  [id number]  MEDICAL RECORD NO.:  [id number]           PATIENT TYPE:  I  LOCATION:  1610                         FACILITY:  [facility]  PHYSICIAN:  Ollen Gross, M.D.    DATE OF BIRTH:  [birth year]  DATE OF ADMISSION:  10/05/2010 DATE OF DISCHARGE:  10/09/2010                        DISCHARGE SUMMARY - REFERRING   ADMITTING DIAGNOSES: 1. Osteoarthritis, right knee. 2. Hyperlipidemia. 3. Past history of atrial fibrillation. 4. Past history of pneumonia with sepsis. 5. Hypothyroidism. 6. Hearing deficit. 7. Benign prostatic hypertrophy. 8. Gastroesophageal reflux disease. 9. Mild neuropathy. 10.Nocturia. 11.Scoliosis.  DISCHARGE DIAGNOSES: 1. Osteoarthritis, right knee status post right total knee replacement     arthroplasty. 2. Postoperative acute blood loss anemia, did not require transfusion. 3. Postoperative hyponatremia, improved. 4. Postoperative hypokalemia, improved. 5. Hyperlipidemia. 6. Past history of atrial fibrillation. 7. Past history of pneumonia with sepsis. 8. Hypothyroidism. 9. Hearing deficit. 10.Benign prostatic hypertrophy. 11.Gastroesophageal reflux disease. 12.Mild neuropathy. 13.Nocturia. 14.Scoliosis.  PROCEDURE:  October 05, 2010 right total knee arthroplasty.  Surgeon, Dr. Lequita Halt.  Assistant, Rozell Searing PA-C.  Spinal, which was converted over to a general.  Tourniquet time, 45 minutes.  CONSULTS:  Medical Services, Dr. Kennedy Bucker.  BRIEF HISTORY:  The patient is a 77 year old male, especially seen by Dr. Lequita Halt for ongoing right knee pain.  He has known end-stage arthritis, has failed nonoperative management, and now presents for total knee arthroplasty.  LABORATORY DATA:  Preop CBC showed a hemoglobin of 12.9, hematocrit of 39.8, white cell count 8.3, platelets 193.  PT/INR 14.8, 1.14 with PTT of 44.  Chem panel on admission all within normal limits.  Preop UA showed trace ketones,  otherwise negative.  Blood group type A+.  Nasal swabs were negative, staph aureus negative for MRSA.  Serial CBCs were followed, hemoglobin dropped down to 9.5, stabilized hemoglobin and hematocrit was 8.8 and 26.1.  Serial pro-time is followed per Coumadin protocol.  Serial PT/INR 17.5 and 1.41.  Serial BMETs were followed for 48 hours.  Sodium did drop from 140 to 130 back up to 136, potassium dropped from 4.8 to 3.4 back up to 3.5.  EKG dated August 20, 2010 normal sinus rhythm, pattern in V1 suggests right ventricular conduction delay, minimal voltage criteria for LVH, this is unconfirmed.  HOSPITAL COURSE:  The patient was admitted to [facility], taken to OR, underwent above-stated procedure without complication.  The patient tolerated procedure well, later transferred to recovery room in orthopedic floor.  He was seen postop by Dr. Kennedy Bucker from medical standpoint.  He had a pretty rough night with his pain on the evening of surgery, doing a little bit better on the morning of day one, encouraged p.o. and IV meds.  He was started back on his home medication.  He had a history of atrial fib, so he was placed on metoprolol perioperatively. His hemoglobin was down to 9.5.  So, we started him on iron supplements. He had good urine output even though sodium was low, felt to be a dilutional component and his sodium did come back up on it own. Potassium was  down to 3.4, but his potassium is a little bit better by day two.  He started getting to feel better on day one, was slowly progressed by day two.  Pain was under a little bit better control.  He was walking about 50 to 60 feet.  He wanted initially to go home, but family was concerned with his age and felt he might need a little bit additional skilled facility.  So, we got social work involved on day two.  We changed the dressing, incision looked good.  Hemoglobin was 9.7 at that point by day three.  He continued to  progress with physical therapy, but albeit slowly and felt that he would need one more day of therapy and again that it was felt he would be better served by undergoing a skilled facility.  He was seen on rounds on Friday on October 09, 2010, the patient is doing well.  I discussed rehab with his wife and his wife did want him to go.  I discussed with Dr. Lequita Halt, it was felt he would best be served by undergoing rehab.  So, we are looking for a bed.  The bed became available on Friday October 09, 2010.  We allowed him to go over at that time.  DISCHARGE PLAN: 1. Possible discharge to a skilled nursing facility on October 09, 2010. 2. Discharge diagnosis, please see above. 3. Discharge meds:     a.     Current medications at time of transfer include Coumadin      protocol.  Please titrate the Coumadin level for a target INR      between 2.0 and 3.0, he needs to be on Coumadin for 3 weeks from      the date of surgery of October 05, 2010.     b.     Colace 100 mg p.o. b.i.d.     c.     Carbamide peroxide 6-8 drops daily.     d.     Toprol-XL 25 mg p.o. daily.     e.     Nu-Iron 150 mg p.o. daily for 3 weeks and discontinue the Nu-      Iron.     f.     Tylenol 325 mg one or two every 4-6 hours as needed for mild      pain, temperature, or headache.     g.     Laxative of choice.     h.     Enema of choice.     i.     Robaxin 500 mg p.o. q.6-8 h p.r.n. spasm.     j.     Ultracet 1-2 tablets every 6 hours as needed for pain.     k.     Vicodin 5 mg 1 tablet every 4-6 hours as needed for severe      pain only.  DIET:  Heart-healthy diet.  ACTIVITY:  He is weightbearing as tolerated.  Total knee protocol to right leg.  PT and OT for gait training, ambulation, ADLs, range of motion, and strengthening exercises.  Please note, he may start showering once he is transferred to the skilled facility; however, do not submerge the incision under water.  DISPOSITION:  Pending at time of dictation,  awaiting on final bed offers.  CONDITION ON DISCHARGE:  Slowly improving, felt to be a good rehab candidate.     Alexzandrew L. Julien Girt, P.A.C.   ______________________________ Ollen Gross, M.D.    ALP/MEDQ  D:  10/09/2010  T:  10/09/2010  Job:  [id number]  cc:   Ollen Gross, M.D. Fax: [phone]  Dr. Kennedy Bucker  Skilled nursing facility of choice  Electronically Signed by Patrica Duel P.A.C. on 10/30/2010 10:29:08 AM Electronically Signed by Ollen Gross M.D. on 11/02/2010 07:10:26 AM

## 2010-12-22 NOTE — Assessment & Plan Note (Signed)
[facility]                                 ON-CALL NOTE   NAME:Danford, [name]                       MRN:          [id number]  DATE:12/10/2006                            DOB:          02-16-1918    PRIMARY CARDIOLOGIST:  Dr. Ladona Ridgel.   PRIMARY CARE PHYSICIAN:  Dr. Timothy Lasso.   SUMMARY OF HISTORY:  Mr. Scala, Dr. Norris Cross son, calls on Dec 10, 2006,  stating that his father was discharged from the hospital yesterday, and  today, while he was visiting, he noticed some slight swelling and  discoloration in his father's lower extremities.  He states that he is  not having any difficulty such as shortness of breath, palpitations.  He  states that they propped his feet up and the color has improved.  I have  asked him to check the blood pressure which is approximately 120/80, and  pulse was 83.   I reassured the son that it did not sound like he was having any acute  difficulties, and that he could continue with his current medications.  I also reassured him that based on information available to me from E-  chart, that the patient does not have any problems with circulation to  his lower extremities given to adequate pulses or reasons for congestive  heart failure.  I asked him to keep his followup appointment with Dr.  Ladona Ridgel on Wednesday.  If she should develop any acute problems or  concerns, to please call us back and we would be happy to evaluate him  in the emergency room.  His son was agreeable with this plan.      Joellyn Rued, PA-C  Electronically Signed      Duke Salvia, MD, [facility]  Electronically Signed   EW/MedQ  DD: 12/10/2006  DT: 12/10/2006  Job #: [id number]

## 2010-12-22 NOTE — Assessment & Plan Note (Signed)
[facility]                                 ON-CALL NOTE   NAME:[name], [name]                         MRN:          [id number]  DATE:12/15/2006                            DOB:          [birth year]    PRIMARY CARDIOLOGIST:  Dr. Lewayne Bunting.   [name] is an 77 year old male with a history of atrial fibrillation,  recently diagnosed, who was admitted to the hospital on December 04, 2006,  and an EP consult was called.  His initial admitting symptoms were  chills and shaking, however, at that time, his temperature was 99.  The  symptoms was felt secondary to atrial fibrillation with rapid  ventricular response and he was cardioverted on Dec 09, 2006, and  discharged home.   This evening, I received a call from [name]'s daughter-in-law who  stated that [name] was having shaking episodes again.  She also was  concerned that he was hypertensive with a systolic blood pressure of  greater than 160.  She stated his heart rate, according to the blood  pressure machine, was 91.  She could not tell me if it was regular or  irregular.   [name] daughter-in-law stated that he had not had his evening dose  of metoprolol.  Initially, the plan was to give him his evening dose of  metoprolol and check him in an hour, but I requested that they check his  temperature.  They called back because his temperature was greater than  102.  He was not reporting cough or dysuria, but I advised them that it  was unwise to leave him at home with a temperature greater than 102.  I  requested that they call EMS to get him to the hospital as he was  feeling very weak and I was not sure he could walk safely to the car.  I  spoke with [name] wife as well as his daughter-in-law.  They  stated that they would comply and bring him via EMS to the emergency  room for evaluation.      [name], PA-C  Electronically Signed      [name], MD, [facility]  Electronically Signed   RB/MedQ  DD: 12/15/2006  DT: 12/16/2006  Job #: [id number]

## 2010-12-22 NOTE — Consult Note (Signed)
[name], [name]                ACCOUNT NO.:  [id number]   MEDICAL RECORD NO.:  [id number]          PATIENT TYPE:  INP   LOCATION:  2920                         FACILITY:  [facility]   PHYSICIAN:  Doylene Canning. Ladona Ridgel, MD    DATE OF BIRTH:  05-27-1918   DATE OF CONSULTATION:  12/05/2006  DATE OF DISCHARGE:                                 CONSULTATION   ELECTROPHYSIOLOGY CONSULTATION   REFERRING PHYSICIAN:  The consultation is requested by Dr. Reginia Forts.   DATE OF CONSULTATION:  December 05, 2006.   INDICATION FOR CONSULTATION:  Evaluation of atrial fibrillation with a  rapid ventricular response.   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  His was in his usual state  of health until yesterday morning when he awoke and had fevers and  chills but no other localizing symptoms.  He felt badly and for this  reason, he ultimately went to the emergency department.  He was noted to  have some shortness of breath and was subsequently found to be in atrial  fibrillation with a rapid ventricular response.  The patient was treated  with intravenous Cardizem and begun on IV heparin.  He spontaneously  returned to sinus rhythm.  On the medications  he developed some  bradycardia and some hypotension and ultimately had to have calcium and  atropine initially.  He denies any history of syncope.  He denies  palpitations.  He denies chest pain.   PAST MEDICAL HISTORY:  Notable for fairly severe arthritis.  His  surgical history is notable for hemorrhoidectomy approximately 35 years  ago and an inguinal hernia repair 25 years ago.   SOCIAL HISTORY:  The patient is a retired Education officer, community who lives in  [place].  He has a remote smoking history.  He drinks alcohol  socially.   FAMILY HISTORY:  His family history is notable for many of his relatives  living into their 100's.  There is no history of cardiac disease.   REVIEW  OF SYSTEMS:  His review of systems are as noted in the HPI.  He  also has severe knee pain and had to give up tennis several months ago.  He has considered surgery on his knees but is presently not interested  in this option.  The rest of his 12 point review of systems was negative  and reviewed.   PHYSICAL EXAMINATION:  GENERAL:  On physical exam he is a pleasant well-  appearing elderly man in no acute distress.  VITAL SIGNS:  Blood pressure was 95/62, pulse was 66 and regular,  respirations were 18, the weight was 73 kg.  HEENT:  Normocephalic and atraumatic.  Pupils equal, round.  Oropharynx  is moist.  Sclerae anicteric.  NECK:  Revealed no jugular venous distension.  There is no thyromegaly.  Trachea is midline.  Carotid's are 2+ symmetric.  LUNGS:  Clear bilaterally to auscultation.  No wheezes, rales or rhonchi  are present.  CARDIOVASCULAR:  The cardiac exam revealed a regular rate and rhythm  with normal S1-S2.  The precordium was quiet.  I did not appreciate any  murmurs.  The PMI was not enlarged nor laterally displaced.  ABDOMEN:  Soft, nontender, nondistended with no organomegaly.  Bowel  sounds are present and there is no rebound or guarding.  EXTREMITIES:  Demonstrate no cyanosis, clubbing or edema.  Pulses were  2+ and symmetric.  NEUROLOGIC:  Alert x3 with the cranial nerves intact.  Strength is 5/5  and symmetric.   CLINICAL DATA:  EKG demonstrates initially atrial fibrillation with  rapid ventricular response.  The subsequent  EKG's demonstrate sinus  rhythm with normal axis and intervals.   IMPRESSION:  1. New onset atrial fibrillation.  2. Borderline cardiac enzymes.  3. Initiation of Coumadin therapy.  4. Arthritis.   DISCUSSION:  I discussed the treatment options with the patient and his  son.  I have recommended that he be transitioned over to Coumadin  and  use low-dose beta blockers to help with the rate control.  Hopefully his  atrial fibrillation  will be quiet.  With regard to his borderline  elevation of cardiac enzymes, there is no real strong family history of  coronary disease and as he is asymptomatic, we will plan on allowing him  an outpatient stress test unless his echo demonstrates severe LV  dysfunction.      Doylene Canning. Ladona Ridgel, MD  Electronically Signed     GWT/MEDQ  D:  12/05/2006  T:  12/05/2006  Job:  [id number]   cc:   Gwen Pounds, MD

## 2010-12-22 NOTE — Op Note (Signed)
NAMEJOHNROBERT, FOTI                ACCOUNT NO.:  [id number]   MEDICAL RECORD NO.:  [id number]          PATIENT TYPE:  INP   LOCATION:  2920                         FACILITY:  [facility]   PHYSICIAN:  Doylene Canning. Ladona Ridgel, MD    DATE OF BIRTH:  1917/11/05   DATE OF PROCEDURE:  12/09/2006  DATE OF DISCHARGE:                               OPERATIVE REPORT   PROCEDURE PERFORMED:  DC cardioversion.   INDICATIONS:  Symptomatic atrial fibrillation and flutter.   INTRODUCTION:  The patient is a 77 year old man who is admitted to  hospital 5 days ago with atrial fib and flutter with rapid ventricular  response.  He had had this procedure by subjective fevers and chills.  He essentially was initially back in sinus rhythm after he was given  Cardizem and beta blockers but then reverted back to atrial  fibrillation.  He had transient changes in his mental status and CT scan  was done demonstrating no acute neurologic event.  The patient was  started on amiodarone and is now referred for DC cardioversion.  The  patient has been on heparin and Coumadin.  INR today is 2.5.   PROCEDURE:  After informed consent was obtained, the patient taken  diagnostic EP lab in fasting state.  After usual preparation he was  sedated with fentanyl and Versed.  200 joules synchronized DC energy was  applied to the electrodispersive pads in the anterior-posterior position  which resulted in restoration of sinus rhythm.  The patient tolerated  procedure well.  He was given Romazicon to reverse his sedation and  returned to his room in satisfactory condition.   COMPLICATIONS:  There were no immediate procedure complications.   RESULTS:  This demonstrates successful DC cardioversion in a patient  with symptomatic atrial fibrillation and flutter.      Doylene Canning. Ladona Ridgel, MD  Electronically Signed     GWT/MEDQ  D:  12/09/2006  T:  12/09/2006  Job:  [id number]   cc:   Gwen Pounds, MD

## 2010-12-22 NOTE — Discharge Summary (Signed)
[name], [name]                ACCOUNT NO.:  [id number]   MEDICAL RECORD NO.:  [id number]          PATIENT TYPE:  INP   LOCATION:  2920                         FACILITY:  [facility]   PHYSICIAN:  Doylene Canning. Ladona Ridgel, MD    DATE OF BIRTH:  1917-12-26   DATE OF ADMISSION:  12/04/2006  DATE OF DISCHARGE:  12/09/2006                               DISCHARGE SUMMARY   TIME SPENT:  Greater than 45 minutes.   ALLERGIES:  THIS PATIENT HAS NO KNOWN DRUG ALLERGIES.   PRINCIPAL DIAGNOSES:  1. Admitted with fever, chills and dyspnea.      a.     Atrial fibrillation rapid ventricular rate/atrial flutter.      b.     Amiodarone started December 06, 2006, converting atrial       fibrillation rapid ventricular rate to atrial fibrillation       controlled ventricular rate.      c.     Direct current cardioversion Dec 09, 2006, atrial       fibrillation controlled ventricular rate to sinus rhythm.      d.     Blood cultures x2 December 04, 2006, one positive for       Pseudomonas; recultured December 07, 2006, both negative so far.      e.     [name] IV medication for 3 days and home on Avelox       400 mg daily for 5 days.      f.     Home on Coumadin therapy.  2. A 2-D echocardiogram ejection fraction 55-60%, no left ventricular      wall motion abnormalities, no aortic stenosis, trivial mitral      regurgitation.  3. Confusion first 2 days here at hospital computed tomogram of the      head generalized atrophy, no acute process.  Confusion resolved at      discharge.  4. Hypokalemia, intermittent.  The patient home on 20 mEq of potassium      daily.   SECONDARY DIAGNOSES:  1. Osteoarthritis.  2. Status post hemorrhoidectomy.  3. Status post inguinal herniorrhaphy.   PROCEDURE:  1. Computed tomogram of the had generalized atrophy, no acute process.  2. A 2-D echocardiogram December 05, 2006, ejection fraction of 55-60%,      no left ventricular wall motion abnormalities, no aortic stenosis,  trivial mitral regurgitation.  3. DCCV Dec 09, 2006, the patient maintaining sinus rhythm after      conversion from atrial fibrillation controlled ventricular rate.      The patient will continue amiodarone as outpatient therapy.  He is      also on Coumadin therapy.   BRIEF HISTORY:  [name] is an 77 year old male.  He is a retired  Education officer, community.  He has no prior cardiac history.  He presents with 10 hours of  dyspnea and dyspnea on exertion.  He has been relatively healthy until  he developed chills on the morning of December 04, 2006.  He stated that  his symptoms felt like malaria.  He checked his temperature,  it was 99  degrees Fahrenheit.  During that time the patient exerted himself and  noted significant dyspnea when going up stairs.  He has been fairly  active even as of December 03, 2006, the day before this admission.  The  patient denies any dysuria, rash or other GI symptoms.  His daughter  noted his heart rate was in the 120-130 range and he presented to an  urgent care facility.  They found he was in atrial fibrillation with  rapid ventricular rate.  He was subsequently transferred to [facility] for further management   HOSPITAL COURSE:  The patient presented to [facility] with  atrial fib rapid ventricular rate.  He was transferred to telemetry  floor.  He was started on rate control medications.  He had some  hypotension with that.  He was seen in consultation by Dr. Lewayne Bunting  who started him on amiodarone therapy and a low-dose beta blocker.  He  is continuing on home doses as well.  He also was started on Coumadin  therapy December 05, 2006.  The first two evenings he required a sitter for  confusion and agitation.  He also received IV Haldol.  He had a CT of  the head which showed no acute process and his confusion and agitation  has resolved somewhat, although he is still a little bit fatigued at the  time of discharge.  On amiodarone, his atrial  fibrillation became a  controlled atrial fibrillation.  His Coumadin became therapeutic on Dec 09, 2006, the day of discharge.  He had cardioversion the same day, Dec 09, 2006, and discharges on the following medications:   1. Amiodarone 200 mg tablets 2 tablets daily,  2. K-Dur 20 mEq daily.  3. Coumadin 5 mg tablets 2.5 mg on May 2nd, Friday; 5 mg Saturday, May      3rd; 2.5 mg Sunday, May 4th; 5 mg Monday, May 5th and then to the      [facility] on Tuesday,  May 6th at 10:30.  4. He will take Avelox 400 mg daily for 5 days.  5. Enteric-coated aspirin 81 mg daily.  6. Metoprolol 25 mg tablets 1/2 tablet in the morning, 1/2 tablet in      the evening.  7. For wheezing Xopenex HFA inhaler 2 puffs three times daily.   FOLLOW UP:  He will see Dr. Ladona Ridgel, [facility], [address] Wednesday, Jan 04, 2007, at 10:45.   LABORATORY STUDIES THIS ADMISSION:  TSH was 2.042.  First blood cultures  on December 04, 2006, showed one positive for Pseudomonas coming from the  left wrist, the other was negative.  Reculture on December 07, 2006, these  cultures are negative at the time of discharge.  Complete blood count on  the day of discharge: Hemoglobin 13.1, hematocrit 38.9, white cells are  12.2, platelets are 172.  Serum electrolytes day of discharge: Sodium  128, potassium 3.2 (this was amended early in the morning with 40 mEq  potassium), chloride 99, carbonate 22, BUN is 23, creatinine 0.93, and  glucose 131.  ProTime on the day of discharge is 28.4, INR is 2.5,  alkaline phosphatase on admission was 48, SGOT was 46, SGPT was 32.  Troponin I studies are 0.2 then 0.34 then 0.24 then 0.14.  Calcium on  the day of discharge is 7.9.  His calcium has run anywhere from 7.9 to  8.4.  Urinalysis was  negative.  D-dimer was 1.62 on December 07, 2006.      Gerald Mirza, PA      Doylene Canning. Ladona Ridgel, MD  Electronically Signed   GM/MEDQ  D:  12/09/2006  T:  12/09/2006  Job:   [id number]   cc:   Gerald Pounds, MD

## 2010-12-22 NOTE — H&P (Signed)
[name], [name]                ACCOUNT NO.:  [id number]   MEDICAL RECORD NO.:  [id number]          PATIENT TYPE:  IPS   LOCATION:  4010                         FACILITY:  [facility]   PHYSICIAN:  Ranelle Oyster, M.D.DATE OF BIRTH:  [birth year]   DATE OF ADMISSION:  01/04/2007  DATE OF DISCHARGE:                              HISTORY & PHYSICAL   CHIEF COMPLAINT:  Deconditioning and confusion.   PRIMARY CARE PHYSICIAN:  Gwen Pounds, M.D.   HISTORY OF PRESENT ILLNESS:  This is an 77 year old white male with a  mit endplate April early May with fever and chills secondary to  pseudomonas bacteremia and findings of atrial fibrillation.  He was  placed on Coumadin and underwent cardioversion.  The patient was  discharged home on Avelox and readmitted on May 9, with failure to  thrive and fever.  Chest x-ray revealed a right lung empyema and right  lower lobe pneumonia.  The patient underwent a VATS with drainage of  empyema and decortication of the right lung on May 9, by Dr. Tyrone Sage.  Chest tube was in place for a short time.  The patient has been followed  by  infectious disease and all antibiotics since have been discontinued.  A chest x-ray on May 25, revealed mild atelectasis without acute  findings.  Course is complicated by delirium, likely due to medication,  which has improved steadily.  A head CT is without acute changes.  The  patient is n.p.o. and PEG was placed for tube feeds.  Eventually due to  continued dysphagia a peg was placed on May 23.  The patient continues  to suffer with problems with weakness and general deconditioning and  thus was brought to the inpatient rehab unit today.   REVIEW OF SYSTEMS:  Notable for improving insomnia.  The patient denies  any shortness of breath or chest pain.  Bowel and bladder function had  been fair.  The patient does complain of left knee pain, which is  chronic.  Other pertinent positives are listed above and full reviews in  the written H&P.   PAST MEDICAL HISTORY:  Positive for:  1. Recent pseudomonas bacteremia.  2. Atrial fibrillation with Coumadin started on May 8.  3. Hypokalemia.  4. History of hemorrhoid surgery in the remote past.  5. Bilateral inguinal hernia repair.   SOCIAL HISTORY:  The patient occasionally drinks and does not smoke.  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  He lives in a one-  level house with 4 steps to enter.  His son lives in the area and is a  Heritage manager.   FAMILY HISTORY:  Positive for CAD.   FUNCTIONAL STATUS:  The patient is completely independent prior to  arrival for his initial admission in April of this year, now he needs  minimum to moderate assistance for basic mobility and self-care.   ALLERGIES:  NONE.   HOME MEDICATIONS:  1. Amiodarone 400 mg b.i.d.  2. K-Dur 20 mEq daily.  3. Coumadin daily.  4. Avelox daily.  5. Metoprolol 12.5 b.i.d.  6. Xopenex 2 puffs b.i.d.   LABORATORY DATA:  Hemoglobin 11.6, white count 9.9, platelets 350,000.  Sodium 140, potassium 3.4, BUN and creatinine 25 and 0.85.   PHYSICAL EXAMINATION:  VITAL SIGNS:  Blood pressure is 105/63, pulse is  70, respiratory rate 20, temperature 97.9.  The patient is satting 92%  on room air.  GENERAL:  The patient is generally pleasant, alert and oriented x3.  EARS, NOSE, AND THROAT EXAM:  Unremarkable with fair to borderline  dentition.  Mucosa is pink and moist.  The patient is hard of hearing.  NECK:  Supple without JVD or lymphadenopathy.  CHEST:  Clear to auscultation bilaterally, except at the bases with  decreased sound at the right base.  HEART:  Regular rhythm and rates.  EXTREMITIES:  Show no cyanosis, clubbing, or edema.  ABDOMEN:  Soft, nontender, with PEG in place without signs of drainage  or discharge.  SKIN:  Intact other than the PEG site.  NEUROLOGICALLY:  Cranial nerves II-XII were notable for decreased  hearing.  Reflexes are 2+.  Sensation  is grossly intact.  Judgment,  orientation, memory, and mood were fair.  His cognition was somewhat  clouded by hearing loss and although he seemed to be generally  appropriate when he was able to hear the information given or questions  spoken to him.   ASSESSMENT AND PLAN:  1. Functional deficits secondary to deconditioning after prolonged      course of pneumonia and empyema.  The patient was status post video      assisted thorascopic surgery on Dec 16, 2006.  The patient's      delirium is resolving.  Begin comprehensive inpatient rehab and      physical therapy to assess and treat for range of motion,      strengthening, bed mobility, transfers.  Occupational therapy will      assess for range of motion, strengthening, cognizance/perceptual      training and equipment.  Rehab nurse will follow on a 24 hour basis      for skin, bowel, bladder, and wound issues.  Also will monitor the      nutrition and administer tube feeds.  Speech and language pathology      are all for dysphagia.  Case manager/social worker was assessed for      psychosocial needs and discharge planning.  Estimated length of      stay:  2 weeks.  Goals:  Supervision and modified independent.      Prognosis is good.  2. Anticoagulation with Coumadin.  INR is therapeutic at 2.2.  3. Dysphagia:  Continue tube feeds.  Speech will follow.  The patient      seems to be tolerating these well at the moment.  4. Atrial fibrillation:  The patient is currently in normal sinus      rhythm.  Continue amiodarone 200 mg b.i.d. via the tube as well as      Lopressor 12.5 mg b.i.d.      Ranelle Oyster, M.D.  Electronically Signed     ZTS/MEDQ  D:  01/04/2007  T:  01/04/2007  Job:  [id number]

## 2010-12-22 NOTE — Assessment & Plan Note (Signed)
[facility]                         GASTROENTEROLOGY OFFICE NOTE   NAME:[name], [name]                       MRN:          [id number]  DATE:03/20/2007                            DOB:          Oct 03, 1917    GASTROINTESTINAL PROBLEM LIST:  Status post PEG placement by me Dec 30, 2006. This was for critical illness with decondition pneumonia,  dysphagia.   INTERVAL HISTORY:  I last saw [name] at the time of his  hospitalization. I placed a PEG tube in late May which he used while he  was hospitalized, but his swallowing improved well enough that he has  not really used it in at least 1 to 2 months now. He is here to discuss  removal of the PEG. He had a swallow evaluation 2 weeks ago that shows  that he does have some minor aspiration risk, but he was given some  guidance by speech therapy on how to decrease this risk, and he has been  doing that. He has no overt aspiration with coughing when he eats. They  have not used the tube in at least 2 months, although his wife flushes  it twice daily, re-bandages it twice daily.   CURRENT MEDICATIONS:  Coumadin, amiodarone, metoprolol, pantoprazole.   PHYSICAL EXAMINATION:  Weight 146 pounds, blood pressure 116/54, pulse  72.  CONSTITUTIONALLY: Generally well-appearing.  ABDOMEN: Binder in place, this was removed. PEG tube was in good  position. There was a bit of slack on the bumper and so I tightened that  down. It was a very long tube and I cut that so it is easier for him to  manage.   ASSESSMENT/PLAN:  An 77 year old man status post late May 2008 PEG  placement, improved swallowing wants the PEG removed.   I let him know that his swallow evaluation was not completely normal and  that one can make the argument of keeping the PEG tube in place to use  in case he has further troubles with swallowing. He is not interested in  that, he is actually very interested in getting the PEG tube removed. He  is on Coumadin for new atrial fibrillation and so I do not think it is  safe to remove the PEG today at this office visit. We will contact Dr.  Ferd Hibbs office to discuss how to bridge him off the Coumadin, whether to  stop completely, or whether they will want [place] to bridge him with Lovenox  since he has such a recent onset atrial fibrillation. Either way I will  schedule him for a repeat appointment next week and we will get the PEG  out at that time. If he is doing Lovenox still, I will want him to have  no Lovenox within 24 hours of pulling the PEG.     Rachael Fee, MD  Electronically Signed    DPJ/MedQ  DD: 03/20/2007  DT: 03/21/2007  Job #: [id number]   cc:   Gwen Pounds, MD

## 2010-12-22 NOTE — H&P (Signed)
NAMESTELLAN, [name]                ACCOUNT NO.:  [id number]   MEDICAL RECORD NO.:  [id number]          PATIENT TYPE:  INP   LOCATION:  3306                         FACILITY:  [facility]   PHYSICIAN:  Kari Baars, M.D.  DATE OF BIRTH:  17-Dec-1917   DATE OF ADMISSION:  12/16/2006  DATE OF DISCHARGE:                              HISTORY & PHYSICAL   PRIMARY CARE PHYSICIAN:  Gwen Pounds, MD.   CARDIOLOGIST:  Doylene Canning. Ladona Ridgel, MD   CHIEF COMPLAINT:  Right-sided chest pain.   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  The patient  has been in excellent health until he presented on April 27 with rigors  and increased heart rate secondary to atrial fibrillation with a rapid  ventricular rate.  He was initially treated with amiodarone and then  underwent DC cardioversion and Coumadin therapy.  During that  hospitalization, he was found to have blood cultures positive for  pseudomonas.  This was treated with Elita Quick for 3 days and was  transitioned to Avelox on discharge.  Of note, 1 out of 2 blood cultures  was positive on with pseudomonas that was resistant to quinolones.  The  patient states that during that hospitalization he developed pleuritic  chest pain and cough about 2-3 days after admission.  Following his  cardioversion he was discharged home, but remained very weak and had  difficulty ambulating.  His appetite was poor.  The wife notes that he  is lost some weight with this episode.  He has continued to have a cough  with chest pain on the right side which is worse when he coughs or takes  a deep breath.  This has progressively worsened; and he has had  associated chills and sweats.  He presented to the emergency department,  tonight, where he had a fever  of 101.3, markedly elevated white blood  count, and a chest x-ray with new right lower lobe infiltrate.  CT was  obtained and shows findings consistent with a right-sided empyema.   REVIEW OF SYSTEMS:  All systems were reviewed with the patient and the  family; and are negative except as in the HPI with following exceptions:  He has been on Coumadin but his INR was increased significantly  recently.  This has been held for the last 2 days.  The patient states  that he has had lower extremity edema for the past 6 months.  He has  excellent exercise tolerance at baseline [facility]; very  functional.   PAST MEDICAL HISTORY:  1. Atrial fibrillation status post DC cardioversion (May 2) on      Coumadin therapy and amiodarone.  2. Pseudomonas bacteremia (December 04, 2006) treated with Elita Quick to      Avelox.  3. Delirium during hospitalization, resolved.  4. Head CT negative.  5. Hypokalemia.  6. Osteoarthritis.  7.  Status post hemorrhoidectomy.  8. Status post bilateral inguinal hernia repairs.   CURRENT MEDICATIONS:  (All these are new since his recent  hospitalization).  1. Amiodarone 400 mg daily.  2. K-Dur 20 mEq daily.  3. Coumadin -- currently held.  4. Avelox 400 mg daily for 5 days.  5. Aspirin 81 mg daily.  6. Metoprolol 25 mg 1/2 b.i.d..  7. Xopenex HFA two puffs t.i.d.   ALLERGIES:  No known drug allergies.   SOCIAL HISTORY:  He is married with four children.  He is a retired  Education officer, community.  He quit smoking in 1950 but has a one pack per day history for  20 years.   FAMILY HISTORY:  Significant for longevity with his mother living to 4  and his father dying in his 57s.   PHYSICAL EXAM:  VITAL SIGNS:  Temperature 101.3, pulse 85, respirations  24, blood pressure 126/53, oxygen saturation 93% on room air.  GENERAL:  He is fatigued and pale appearing.  HEENT:  Oropharynx is moist.  No scleral icterus.  NECK:  Supple without lymphadenopathy or JVD.  HEART:   Regular rate and rhythm without murmurs, rubs or gallops.  LUNGS:  Decreased breath sounds in the right base with occasional  rhonchi.  ABDOMEN:  Soft, nondistended, nontender with normoactive bowel sounds.  EXTREMITIES:  1+ edema bilateral lower extremities.  NEUROLOGIC:  Alert and oriented x4.  Interactive and follows commands.   LABS:  CBC shows a white count of 22 with 94% segs, 1% lymphs,  hemoglobin 12.4, platelets 492.  BMET significant for sodium 126,  potassium 3.9, chloride 92, bicarb 24, BUN 19, creatinine 0.9, glucose  145.  Blood cultures obtained on April 27 had pseudomonas resistant to  Cipro and Levaquin but sensitive to Zosyn, gentamicin, and imipenem.  Repeat blood cultures on April 30 were negative x2.   STUDIES OBTAINED TODAY:  1. Chest x-ray shows a large right lower lobe pneumonia with effusion.      Possible left upper lobe infiltrate -- these are all new compared      to April 27.  2. Chest CT shows a complicated pneumonia with right empyema and      likely pulmonary necrosis and abscess.  Left upper lobe nodule      suspicious for bronchogenic carcinoma.   ASSESSMENT/PLAN:  1. Right lower lobe empyema -- this is likely due to pseudomonas given      his recent blood cultures.  Will treat him based on these cultures      with Zosyn and gentamicin for his synergy based on resistance      patterns to quinolones.  I have consulted thoracic surgery who      plans to place a chest tube once his INR has improved.  Will repeat      blood cultures and sputum cultures.  Admit to ICU for close      monitoring.  Patient and the family are aware of the potential for      acute decompensation.  2. Left upper lobe nodule -- this is concerning for malignancy in the      setting of his prior smoking history.  Will consider PET scan once      #1 is stable.  Alternatively, this may be bilateral pneumonia in     the setting of his pseudomonas.  3. Atrial fibrillation -- will  hold his Coumadin.  PT/INR is pending,      at this time, but is  likely to be elevated.  He may need FFP or      vitamin K prior to chest tube placement.  Will continue his      amiodarone.  The atrial fibrillation was actually probably      secondary to Pseudomonas bacteremia/sepsis.  He is currently in      normal sinus rhythm.  Obtain an EKG.  4. Hyponatremia -- likely due to syndrome of inappropriate      antidiuretic hormone secretion secondary to #1.  Will      monitor with treatment.  5. Ethics -- I have discussed this with the patient and his family.      He does desire to be full code which is a very reasonable decision      in the setting of his very functional baseline.      Kari Baars, M.D.  Electronically Signed     WS/MEDQ  D:  12/16/2006  T:  12/16/2006  Job:  [id number]   cc:   Gwen Pounds, MD  Fax: [phone]   Doylene Canning. Ladona Ridgel, MD  [address]  [address]  [place]  [place] [address]

## 2010-12-22 NOTE — Op Note (Signed)
[name], [name]                ACCOUNT NO.:  [id number]   MEDICAL RECORD NO.:  [id number]          PATIENT TYPE:  INP   LOCATION:  2308                         FACILITY:  [facility]   PHYSICIAN:  Sheliah Plane, MD    DATE OF BIRTH:  May 21, 1918   DATE OF PROCEDURE:  12/16/2006  DATE OF DISCHARGE:                               OPERATIVE REPORT   PREOPERATIVE DIAGNOSIS:  Right lower lobe pneumonia with probable  Pseudomonas right empyema.   POSTOPERATIVE DIAGNOSIS:  Right lower lobe pneumonia with probable  Pseudomonas right empyema.   OPERATION/PROCEDURE:  1. Bronchoscopy.  2. Right video-assisted thoracoscopy.  3. Mini thoracotomy.  4. Drainage of empyema and decortication, right lung.   SURGEON:  Gwenith Daily. Tyrone Sage, M.D.   FIRST ASSISTANT:  Coral Ceo, P.A.   BRIEF HISTORY:  The patient is an 77 year old who had been relatively  good health until approximately two weeks ago prior when he had an  episode of fever and rigors and presented to the emergency room and was  found to be in atrial fibrillation.  He was hospitalized for a period of  time, treated for the atrial fibrillation.  Chest x-ray was clear.  He  had one positive blood culture for Pseudomonas.  Ultimately was  discharged home and returned three to four days later with increasing  shortness of breath and recurrent fever.  Chest x-ray and CT scan showed  evidence of extensive consolidation of the right lower lung with empyema  formation.  The patient had pleural effusion on the right.  The patient  had been coumadinized.  His INR was corrected partially and drainage of  empyema was recommended.  The patient agreed and signed the informed  consent.   DESCRIPTION OF PROCEDURE:  A central line arterial was placed.  The  patient was underwent general endotracheal anesthesia without incident.  Through the endotracheal tube, the bronchoscope was passed to the  subsegmental bronchial level without evidence of  endobronchial lesions.  Sputum was cultured.  Scope was removed.  Double lumen endotracheal tube  was placed.  The patient was turned in the lateral decubitus position  with the right side up.  A mid axillary line port site was created and  the scope was introduced into the chest.  A significant amount of liquid  pleural fluid, foul-smelling.  This was drained and cultured, aerobic  and anaerobic.  As much free fluid as possible was removed.  The scope  was introduced and there were numerous loculated adhesions throughout  the chest.  A small utilitarian incision was made lower than the  previous port site and the ribs were separated and numerous loculations  were broken up, both the anterior and posterior collections were  drained.  Fibrinous debris was debrided off the lung.  With as much  fluid and necrotic debris was removed as possible.  Three chest tubes  were left in place, anterior, posterior, and a middle, right angle tube.  The ribs were reapproximated.  The muscle layers were closed with  interrupted 0 Vicryl and running 0 Vicryl in the subcutaneous tissue.  The initial port site was used as one chest tube site.  Dry dressings  were applied.  The patient was awakened in the operating room and  attempt was  made to extubate him.  However, because of the present respiratory  status, he was reintubated and transferred to the recovery room in  stable but guarded condition.  Blood loss was minimal.  The patient did  have a significant amount of pleural fluid which was removed and  cultured.      Sheliah Plane, MD  Electronically Signed     EG/MEDQ  D:  12/17/2006  T:  12/18/2006  Job:  [id number]   cc:   Gwen Pounds, MD  Doylene Canning. Ladona Ridgel, MD

## 2010-12-22 NOTE — Consult Note (Signed)
NAMEMINARD, MILLIRONS                ACCOUNT NO.:  [id number]   MEDICAL RECORD NO.:  [id number]          PATIENT TYPE:  INP   LOCATION:  2399                         FACILITY:  [facility]   PHYSICIAN:  Sheliah Plane, MD    DATE OF BIRTH:  Mar 31, 1918   DATE OF CONSULTATION:  12/16/2006  DATE OF DISCHARGE:                                 CONSULTATION   REQUESTING PHYSICIAN:  Dr. Clelia Croft.   REASON FOR CONSULTATION:  Positive blood cultures for Pseudomonas, right  lower lobe pneumonia with loculated effusion, possible empyema.   BRIEF HISTORY:  The patient is an 77 year old male who has been in  relatively good health until late April when he presented with onset of  fever, chills, rigors and was hospitalized. At the time, he was  incidentally found to be in rapid atrial fibrillation and underwent  coumadinization and cardioversion. During the hospitalization, 1 of 4  blood cultures grew Pseudomonas resistant to quinolones. The patient had  been discharged home on Avelox before the sensitivities were known.  Several days after being home, he began having fever and chills again  and ultimately presented to the [facility] emergency room approximately  midnight on May 8 with a new right pleural effusion and right pneumonia  which was not present 10 days before. A CT scan showed extensive  loculated pleural effusion anteriorly and posterior with consolidation  of the right lower lobe. The patient had been placed on Coumadin and his  INR was greater than 3. White count was noted to be elevated at 21,000.   PAST MEDICAL HISTORY:  1. History of recently new onset of atrial fibrillation started on      Coumadin, cardioverted on Dec 09, 2006 followed by [facility]      Cardiology. Pseudomonas bacteremia during that hospitalization.  2. History of delirium. A CT scan was performed during the      hospitalization and his mental status improved. Status post      hemorrhoidectomy, status post right inguinal  hernia repair.   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   CURRENT MEDICATIONS:  1. Amiodarone 400 mg b.i.d.  2. Potassium 20 mEq a day.  3. Coumadin.  4. Avelox.  5. Aspirin.  6. Lopressor 12.5 b.i.d.  7. Xopenex 2 puffs b.i.d.   PHYSICAL EXAMINATION:  GENERAL:  The patient is hard of hearing but is  alert and neurologically intact.  VITAL SIGNS:  Temperature was 101.3, heart rate 85 and sinus blood  pressure 120/50. Respiratory rate 22. O2 sats 93% on room air.  The patient does not look toxic but does complain of right pleuritic  pain. He has no jugular venous distention, no carotid bruits. Breath  sounds are decreased over the right lung, especially at the right base,  clearer over the left. He has slight pedal edema.  ABDOMEN:  Benign without palpable tenderness or masses appreciated. He  has a Foley catheter in place.   REVIEW OF SYSTEMS:  The patient does have constitutional symptoms  as he  originally presented and has had intermittent fever and chills over the  past several weeks. He does not mild pedal edema intermittently over the  past 3-4 months, denies hemoptysis, denies blood in his stool or urine,  denies claudication, denies amaurosis. Other review of systems are  negative.   Chest x-ray and CT scans are reviewed as noted above. In addition, the  patient's CT scan has an infiltrative area in the right upper lobe which  was not obvious on previous chest x-ray and also a small approximately 1  to 1-1/2 cm nodule on the right upper lobe. It is unclear if this is a  new infiltrative process or could possibly be a lung nodule consistent  with malignancy.   IMPRESSION:  Patient with pseudomonas bacteremia currently treated with  resistant antibiotics to which it is resistant. The source of infection  is unclear. At the time of original presentation he had a urine culture  that was  negative, no evidence of pseudomonas. He had TEE done for a  cardioversion. Presumably if there was evidence of endocarditis this  would have been noted at the time of TEE. I will plan on proceeding  urgently with drainage of the right chest with a video-assisted  thoracoscopy. The patient has been given vitamin K and fresh frozen.  Will repeat coags and give additional fresh frozen and proceed with  surgery. An ID consult will be obtained. The risks of surgery especially  considering the patient's overall illness and age of 43 years is  discussed with the patient and his son and wife, but he is willing to  proceed.      Sheliah Plane, MD  Electronically Signed     EG/MEDQ  D:  12/16/2006  T:  12/16/2006  Job:  [id number]   cc:   Rollene Rotunda, MD, Charlott Holler, M.D.

## 2010-12-22 NOTE — Discharge Summary (Signed)
[name], [name]                ACCOUNT NO.:  [id number]   MEDICAL RECORD NO.:  [id number]          PATIENT TYPE:  IPS   LOCATION:  4036                         FACILITY:  [facility]   PHYSICIAN:  Ranelle Oyster, M.D.DATE OF BIRTH:  September 16, 1917   DATE OF ADMISSION:  01/04/2007  DATE OF DISCHARGE:  01/17/2007                               DISCHARGE SUMMARY   DISCHARGE DIAGNOSES:  1. Deconditioning - empyema - status post VATS Dec 16, 2006.  2. Pneumonia, resolved.  3. Dysphagia.  4. Delirium, resolved.  5. Atrial fibrillation with Coumadin therapy.   PROCEDURES:  Status post VATS Dec 16, 2006 per Dr. Tyrone Sage, status post  gastrostomy feeding tube May 23 per Dr. Christella Hartigan.   An 77 year old white male recent admission April 27 to Dec 09, 2006 with  fever, chills secondary to Pseudomonas bacteremia, findings of atrial  fibrillation.  Underwent cardioversion.  Placed on Coumadin therapy.  He  was discharged home on Avelox.  Readmitted May 9 after failure to thrive  with fever of 101.3.  Chest x-ray showed a right lung empyema, right  lower lobe pneumonia.  Underwent VATS, drainage of empyema, and  decortication of right lung May 9 per Dr. Tyrone Sage.  A chest tube  remained in place for a short time and later removed.  Infectious  disease follow up Dr. Orvan Falconer.  All antibiotics since discontinued.  Follow-up chest x-ray May 25 with mild atelectasis without acute  findings.   HOSPITAL COURSE:  Delirium steadily improved.  Cranial CT scan May 17  negative.  Swallow study May 20.  The patient n.p.o. with nasogastric  tube feeds.  [name] gastroenterology consulted.  Plan for gastrostomy  feeding tube.  He had been on Coumadin therapy for atrial fibrillation.  He did receive vitamin K to reverse his Coumadin.  A gastrostomy feeding  tube was placed May 23.   PAST MEDICAL HISTORY:  See Discharge Diagnoses.  Occasional alcohol.  Remote smoker.   ALLERGIES:  None.   SOCIAL HISTORY:   Married, retired Education officer, community.  One level home, four steps  to entry.  Son in the area who was a [employer] manager.   MEDICATIONS:  Prior to admission were amiodarone 400 mg twice daily,  potassium chloride daily, Coumadin questionable dose, metoprolol 12.5 mg  twice daily.   REHABILITATION HOSPITAL COURSE:  The patient did well on rehabilitation  services with therapies initiated on a three hour daily basis consisting  of physical therapy, occupational therapy, speech therapy, and  rehabilitation nursing.  The following issues were addressed during  patient's rehabilitation stay.  Pertaining to [name]'s  deconditioning empyema he had undergone VATS Dec 16, 2006.  Surgical site  healing nicely.  Follow-up per cardiothoracic surgery, Dr. Jaynie Collins.  Functionally he was minimal assist for his mobility as well as  activities of daily living with which he was supervision upper body,  minimal assist lower body.  Strength and endurance had greatly improved  throughout his rehab course.  His pneumonia had resolved.  He was off  all antibiotics.  Latest chest x-ray showed some mild right  basilar  atelectasis.  Oxygen saturations remained greater than 95% on room air.  The patient initially n.p.o. after failed swallow study.  A gastrostomy  feeding tube had been placed May 23 per Dr. Christella Hartigan of gastroenterology  services.  Latest swallow study January 13, 2007.  Placed on a dysphagia 2  thin liquid diet with calorie counts.  The patient was eating well, and  plan would be discharge to home with routine flush of PEG tubes.  His  atrial fibrillation was well-controlled.  Heart rate monitored.  Follow  Dr. Ladona Ridgel of cardiology services.  He remained on metoprolol and  amiodarone.  He had no bowel or bladder disturbances.  Latest labs  showed an INR 2.5, hemoglobin 11.6, hematocrit 34.7, sodium 135,  potassium 3.9, BUN 18, creatinine 0.86.   DISCHARGE MEDICATIONS:  At time of dictation included  Coumadin daily  dose to be established at time of discharge with latest dose of 2.5 mg,  metoprolol 25 mg 1/2 tablet twice daily, Protonix 40 mg daily,  amiodarone 200 mg daily.   Home health nurse to check INR results to [facility] [phone],  pharmacy services, Dr. Loletta Specter office. Follow Dr. Tyrone Sage,  cardiothoracic surgery, Dr. Ladona Ridgel, cardiology services, Dr. Christella Hartigan of  gastroenterology services.  The patient presently on a dysphagia 2 thin  liquid diet with routine PEG tube flushes.  He would need PEG tube for  approximately four more weeks to maintain site, and this can be  discontinued as appetite continued to be good.      Mariam Dollar, P.A.      Ranelle Oyster, M.D.  Electronically Signed    DA/MEDQ  D:  01/16/2007  T:  01/16/2007  Job:  [id number]   cc:   Gwen Pounds, MD  Sheliah Plane, MD  Doylene Canning. Ladona Ridgel, MD  Rachael Fee, MD

## 2010-12-22 NOTE — H&P (Signed)
NAMEKIRKLIN, MCDUFFEE                ACCOUNT NO.:  [id number]   MEDICAL RECORD NO.:  [id number]          PATIENT TYPE:  EMS   LOCATION:  MAJO                         FACILITY:  [facility]   PHYSICIAN:  Reginia Forts, MD     DATE OF BIRTH:  10/09/1917   DATE OF ADMISSION:  12/04/2006  DATE OF DISCHARGE:                              HISTORY & PHYSICAL   PRIMARY CARE PHYSICIAN:  Gwen Pounds, MD.   CARDIOLOGIST:  Noralyn Pick. Eden Emms, MD, [facility].   CHIEF COMPLAINT:  Shortness of breath and chills.   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  He has been asymptomatic and healthy  until this morning, when he developed chills at approximately 10:00 a.m.  He stated that the symptoms felt like malaria and decided to check his  temperature, which was only 99.  During that time the patient exerted  himself and noted significant dyspnea on exertion upon going up stairs.  Of note, he was asymptomatic and has been fairly active up until even as  late as yesterday.  Over the next 1-2 hours the chills subsided  spontaneously.  He he noted no other symptoms and denied cough, cold,  any sick contacts, urinary burning, rash, or any GI symptoms.  His  daughter noted his heart rate to be in the 120-130 range, and he  subsequently presented to the urgent care facility.  There he was found  to be in atrial fibrillation with rapid ventricular rate, and was  subsequently transferred to [facility] Room for further  management.   In the ED the patient's initial blood pressure was 100/60, with a heart  rate of 120-130.  He received Diltiazem 50 mg IV bolus and became  hypotensive, requiring IV calcium, atropine, normal saline 250 mL bolus  and [place] position.  He was initially asymptomatic with the blood  pressures in the 60s systolic; but improved as blood  pressure increased.  The patient also noted resolution of his shortness of breath once his  heart rate came down.  Of note, he denies any sensation of palpitations  when his heart rate is controlled or fast.  The patient denies any  orthopnea, paroxysmal nocturnal dyspnea or syncope.   PAST MEDICAL HISTORY:  1. Chronic arthritis of the knees.  2. Chronic stable low blood pressure, with no symptoms.  Blood      pressure runs in approximately the 90s to 100 systolic -- with no      symptoms.   PAST SURGICAL HISTORY:  1. Hemorrhoidectomy 35 years ago.  2. Bilateral inguinal hernia repair 25 years ago.  No complications.   ALLERGIES:  NO KNOWN DRUG ALLERGIES.   MEDICATIONS:  Ibuprofen p.r.n.   SOCIAL HISTORY:  The patient lives in [place].  He is a retired  Education officer, community.  He quit smoking tobacco in 1950, smoking proximal one pack per  day.  He is a social drinker.  Denies any drug use.  FAMILY HISTORY:  Notable for no cardiac disease.  His mother lived to  16 and father lived into his late 70s.   REVIEW OF SYSTEMS:  Notable for bilateral knee arthritis.  The rest of  12 review of systems was reviewed and is negative.   PHYSICAL EXAMINATION:  VITAL SIGNS:  Temperature 97.5, pulse initially  66 and has subsequently increased to 110.  Blood pressure initially over 80/48 and now up to 99/60.  GENERAL:  The patient is awake, alert, oriented x3; in no acute  distress, pleasant.  HEENT:  Normocephalic, atraumatic.  Pupils are equal and round, reactive  to light.  Extraocular movements are intact.  NECK:  Shows no JVD, no carotid bruits.  CARDIOVASCULAR:  Irregular rhythm, normal rate.  Transitioning to  tachycardia with no murmurs, rubs or gallops.  LUNGS:  Cear to auscultation bilaterally.  ABDOMEN: Positive bowel sounds; soft, nontender, nondistended.  EXTREMITIES:  Show no cyanosis, clubbing or edema.  There is 3+ distal  pulses.  MUSCULOSKELETAL:  Demonstrates no joint  deformities or effusions.  NEUROLOGIC:  Cranial nerves II-XII grossly intact.  Nonfocal  musculoskeletal or sensory deficits.  PSYCHIATRIC:  Demonstrates normal  affect.   CHEST X-RAY:  Demonstrates no acute cardiopulmonary findings.   EKG:  Demonstrates tachycardia in the 120s, with atrial fibrillation and  rapid ventricular rate; nonspecific inferior ST changes, borderline LVH  and incomplete right bundle branch block.   LABORATORY DATA:  BUN 19, creatinine 1.1.  White count 12.4 with  96%  neutrophils.  Platelet count 149,000.  Potassium 3.2.  UA is negative.   ASSESSMENT/PLAN:  This is an 77 year old Caucasian male with acute  atrial fibrillation with rapid ventricular rate, and an unknown duration  of his underlying atrial fibrillation.  At this time the patient appears  to be clinically improving, with having received IV calcium and  atropine.  1. ATRIAL FIBRILLATION WITH RAPID VENTRICULAR RATE.  The patient will      require low-dose rate control.  We will initiate to 5 mg an hour      with close monitoring of his blood pressure.  He will be      heparinized until further determination of cardioversion versus      medical therapy can be determined.  The patient is reluctant to      initiate Coumadin therapy and wishes to discuss this matter further      in the next 24 hours.  In the interim we will obtain a      transthoracic echocardiogram and a TSH.  The patient appears to be      a good candidate for Coumadin due to his high functional status.  2. INFECTION.  Blood cultures and urine cultures are pending, to      further delineate the cause of the high      neutrophil count and white count.  At this time the patient was      afebrile.  We will monitor for any indication of infection and      treat with antibiotics once his source has been found.  3. ARTHRITIS.  The patient will receive ibuprofen p.r.n.      Reginia Forts, MD  Electronically Signed    RA/MEDQ  D:   12/04/2006  T:  12/05/2006  Job:  [id number]   cc:   Gwen Pounds, MD  Noralyn Pick Eden Emms, MD, [place]

## 2010-12-22 NOTE — Discharge Summary (Signed)
[name], [name]                ACCOUNT NO.:  [id number]   MEDICAL RECORD NO.:  [id number]          PATIENT TYPE:  INP   LOCATION:  5736                         FACILITY:  [facility]   PHYSICIAN:  Gwen Pounds, MD       DATE OF BIRTH:  05/25/1918   DATE OF ADMISSION:  12/16/2006  DATE OF DISCHARGE:  01/04/2007                               DISCHARGE SUMMARY   DISCHARGE DIAGNOSES:  1. Empyema.  2. Dysphagia and aspiration.  3. Pseudomonas bacteremia.  4. Anemia.  5. Hypertension.  6. Atrial fibrillation status post direct-current cardioversion on Dec 09, 2006, per Dr. Ladona Ridgel.  7. Chronic anticoagulation with Coumadin.  8. Delirium with confusion.  9. Electrolyte abnormalities.  10.History of hemorrhoidectomy.  11.History of bilateral inguinal hernia repair.  12.Deconditioning.  13.Foley trauma with hematuria, resolved.  14.Ventilator-dependent respiratory failure requiring re-intubation      after surgery.  15.Lung nodule.   DISCHARGE PROCEDURES:  1. Bronchoscopy with right video-assisted thoracoscopy, mini      thoracotomy, and drainage of empyema and decortication of the right      lung per Dr. Tyrone Sage.  2. PICC line placement.  3. Upper endoscopy on Dec 30, 2006, with PEG tube placement.  The      endoscopy portion was totally normal.  4. Consultation with infectious disease.  5. Consultation with speech therapy, physical therapy, occupational      therapy.  6. FEES with speech therapy.  7. X-ray of the hand without any acute findings, degenerative joint      disease affecting basilar joint.   HISTORY OF PRESENT ILLNESS:  Briefly, this is an 77 year old man  recently hospitalized for new-onset atrial fibrillation from December 04, 2006, to Dec 09, 2006.  Underwent anticoagulation and DC cardioversion  and was discharged on amiodarone.  He presented to the emergency  department on Dec 15, 2006, with right-sided chest pain and chills.  During the prior admission he did  have some rigors and believed to have  had low-grade temperatures.  Blood cultures were obtained.  The blood  cultures came back with Pseudomonas aeruginosa.  He was treated with  Elita Quick for 3 days and then switched over to Avelox.  The culture and  sensitivity eventually came back that the pseudomonas was resistant to  fluoroquinolones.  He remained weak, physical therapy was not helpful,  his symptoms continued to worsen, and he came to the emergency  department for evaluation and treatment.  There he underwent a chest x-  ray which revealed right lower lobe infiltrate.  CT scan was obtained  and was compatible with a right empyema.  He was admitted by my partner,  Dr. Eric Hunt.  Please see his note for details.  For the right lower  lobe empyema, CT surgery was consulted immediately.  This was originally  presumed to be a pseudomonas empyema.  Dr. Dennie Maizes note reports the  patient with pseudomonas bacteremia currently treated with resistant  antibiotics to which it is resistant.  The source of the infection is  unclear.  At  the time of the original presentation he had urine culture  that was negative, no evidence of pseudomonas.  He did have a TEE done  for the cardioversion.  Presumably, if there was evidence of  endocarditis this would have been noted at the TEE.  Dr. Tyrone Sage  planned with proceeding urgently with drainage of the right chest with a  video-assisted thoracoscopy.  The patient was given vitamin K and fresh  frozen plasma.  He will repeat the coags and give additional fresh  frozen plasma and proceed with surgery.  Recommended ID consultation.  The risks of surgery, especially considering the patient's overall age  of being 60, is discussed with the patient and his family and everyone  is willing to proceed.  Dr. Clelia Croft placed him on Zosyn and re-cultured him  up.  There was also a nodule noted on the CT scan which may be an  underlying malignancy or related to the  underlying infection and the  plan was eventual PET scan or repeat CT scan once the patient was  stable.  Of note, his sodium was low at 126, presumed secondary to SIADH  due to the underlying illness.  He was admitted to the [facility] ICU for  continued evaluation and treatment.   HOSPITAL COURSE:  [name], an 77 year old male admitted with  right-sided loculated pneumonia, empyema, and pseudomonas bacteremia.  He is status post recent atrial fibrillation; status post DC  cardioversion; remains in normal sinus rhythm; had a leukocytosis of  22,000; hyponatremia at 126; and was on anticoagulants for the atrial  fibrillation.  He received fresh frozen plasma and vitamin K to reverse  the Coumadin.  The following morning he remained tired and short of  breath on oxygen and right-sided chest pleurisy.  He was taken to the  operating room later that day for three chest tubes, VATS,  decortication.  The surgery was successful.  He was also evaluated by  Dr. Orvan Falconer from infectious disease who agreed with proceeding with  VATS and agreed with continuing Zosyn and discontinuing the gentamicin.  Dr. Orvan Falconer wrote that the pneumonia with necrosis is probably a  complication of transient bacteremia but he cannot rule out hospital-  acquired pneumonia related to the recent atrial fibrillation and DC  cardioversion.  Zosyn alone should be excellent empiric coverage.  He  will follow along with [place].   After the surgery the patient was extubated but he was unable to protect  his airway and had a depressed respiratory drive and therefore had to be  re-intubated.  Pulmonary critical care was immediately consulted.  He  was eventually extubated on Dec 18, 2006, and tolerated this well.  My  note on Dec 19, 2006, reported that the patient was extubated status  post right VATS and decortication, doing better but confused and pulling  at clothes.  All his vital signs are stable.  His sodium had  improved  back to normal at 137.  His INR remained normal and his white count was  8.0.  He remained with the chest tubes.   Dr. Ladona Ridgel from cardiology came by and saw the patient on Dec 19, 2006;  please see his note.  The cultures finally came back with bacteroides  with the belief that the bacteroides bacteremia pneumonia and empyema  unrelated to the pseudomonas prior and resulted probably from an  aspiration, potentially around the time of cardioversion or with his  underlying illness.  Continued antibiotics were warranted, as  well as  speech therapy evaluation.  Speech therapy saw the patient and felt that  the patient shows positive signs and symptoms of aspiration  characterized by positive cough after swallowing with thin liquids but  he did have a very weak cough.  They recommended a dysphagia 1 honey-  thick liquid, very small sips and bites, full supervision, medications  crushed in apple sauce, and continue following along.  Modified barium  swallow study was done.  Over the next several days he continued to have  trouble with eating and further aspiration and weakness, some diarrhea,  and confusion.  The C. difficile assay remained negative and the  diarrhea eventually improved.  His chest tubes eventually were  discontinued.  We attempted to move around his medications a little bit  see if we could improve his underlying mental function.  He did undergo  a cranial CT which was negative for intracranial abnormalities or  abscesses.   Because of the continued coughing and potential aspiration events and  not getting enough calories, a Panda was placed and he was given  supplemental calories.  This was determined not to be enough and  eventually GI was consulted per Dr. Christella Hartigan.  He ended up undergoing an  endoscopy and a PEG tube placement.  He will continue to work with  speech therapy aggressively and work more towards fixing his underlying  throat and aspiration issues  at the same time that we will continue to  feed him through the PEG.   For the bacteroides pneumonia with empyema, it was determined that he  would finish a full course of antibiotics.   For the pseudomonas bacteremia, there was no recurrence and did not  require any further antibiotics.   For the confusion, this was felt to be secondary to sundowning, recent  surgery, multiple medications, just being in the hospital.  Cranial CT  was negative.  His medications continued to be adjusted and we just  figured that time would be the most help.   For his atrial fibrillation, it was paroxysmal.  He would continue the  amiodarone and Coumadin with a goal INR of 2-3.   The remaining issue is severe and significant deconditioning.  He  remained getting physical therapy, occupational therapy, and case  management was working with [place].  It was eventually decided to get a  rehabilitation consult.  We did discuss going to skilled nursing  facility.  It was determined that he was a great candidate for inpatient  rehab and was eventually taken over to inpatient rehab on Jan 04, 2007.  The issues that will need to be there is a slow recovery to normal of  his baseline mental state.  He is already off antibiotics.  He will need  a followup on his lung nodule.  He will need followup with speech  therapy, e-stimulation, and aggressive working towards resuming an oral  diet.  In the meantime, he will continue on bolus PEG tube feeds.  My  note on Jan 04, 2007, shows that he had no complaints; all his vital  signs were stable; his PEG was clean, dry, and intact; and he was okay  for discharge to rehab.  He was already making progress with the  dysphagia and he was working on deconditioning and there has been  relatively little to no confusion.  His INR was 2.2 and fine and I will  personally continue to follow periodically while he is on the rehab  service.  He is discharged in stable condition.    DISCHARGE MEDICATIONS:  Please see H&P and discharge summary from the  rehab section.      Gwen Pounds, MD  Electronically Signed     JMR/MEDQ  D:  02/28/2007  T:  02/28/2007  Job:  [id number]

## 2010-12-22 NOTE — Assessment & Plan Note (Signed)
[facility]                         GASTROENTEROLOGY OFFICE NOTE   NAME:Madaris, EWALD BEG                       MRN:          [id number]  DATE:03/29/2007                            DOB:          08-20-1917    GI PROBLEM LIST:  1. Status post PEG tube placement by me Dec 30, 2006.  This was for      critical illness with deconditioning, pneumonia, dysphagia.  PEG      tube removed in the office March 29, 2007 without any      complications.   INTERVAL HISTORY  I last saw Shafin a week ago.  We coordinated with his primary care  physician about completely holding his Coumadin.  He returned today for  removal of the PEG tube.   CURRENT MEDICATIONS:  Coumadin (on hold), amiodarone, metoprolol,  pantoprazole.   PHYSICAL EXAMINATION:  Weight 146 pounds.  Blood pressure 108/54.  Pulse  66.  CONSTITUTIONAL:  Generally well appearing.  ABDOMEN:  Soft, nontender, non-distended.  Normal bowel sounds.  PEG dressing removed, and PEG removed with the usual traction technique.  There was very little pain, and no oozing post removal.  The site was  dressed.  The patient was allowed to go home.   ASSESSMENT AND PLAN:  An 77 year old man status post PEG tube removal  today in the office.  He tolerated the procedure very well.  He knows to  call if he has any further trouble.  The PEG site will probably drain  for the next 24 to 36 hours, and then should close up.  It should  definitely be completely closed within a week from now.  I instructed  him to restart his Coumadin today.     Rachael Fee, MD  Electronically Signed    DPJ/MedQ  DD: 03/29/2007  DT: 03/29/2007  Job #: [id number]   cc:   Gwen Pounds, MD

## 2010-12-22 NOTE — Assessment & Plan Note (Signed)
[facility]                         GASTROENTEROLOGY OFFICE NOTE   NAME:[name]                       MRN:          [id number]  DATE:04/18/2007                            DOB:          26-Jun-1918    GI PROBLEM LIST:  1. Status post PEG tube placement by me Dec 30, 2006.  This was for      critical illness with deconditioning, pneumonia, dysphagia.  PEG      tube removed in the office March 29, 2007 without any      complications.   INTERVAL HISTORY:  I last saw [name] 2 to 3 weeks ago and removed his PEG  tube. He called a couple days ago explaining that he was having some  drainage and some maybe a little bleeding at the site. He was not sure  if it had closed up and so he came in today for evaluation.   CURRENT MEDICATIONS:  Coumadin, amiodarone, metoprolol, pantoprazole.   PHYSICAL EXAMINATION:  Weight 149 pounds, blood pressure 100/62, pulse  80.  CONSTITUTIONAL: Well-appearing.  ABDOMEN: Soft, nontender, nondistended. Normal bowel sounds. PEG site,  he has a day old Band-Aid on it that has only a very small dab of  exudate type drainage on it. The PEG site it's self has a 5 mm spot of  exuberant healing tissue. No leakage. No sign of infection. There is no  tenderness or erythema or induration. I applied 2 silver nitrate  applications to the exuberant tissue with the appropriate graying of the  tissue.   ASSESSMENT/PLAN:  An 77 year old man status post PEG tube removal with  some exuberant tissue at the site of removal.   He does not have a patent gastrocutaneous fistula. The PEG site has  healed up very well. This tissue is normal and it usually happens while  the PEG is still in place. I applied some silver nitrate and that should  fix things. If it comes back he can return for repeat silver nitrate  application or the tissue is nothing to be worried about even if it dose  return.     Rachael Fee, MD  Electronically  Signed    DPJ/MedQ  DD: 04/18/2007  DT: 04/19/2007  Job #: [id number]   cc:   Gwen Pounds, MD

## 2011-05-10 DIAGNOSIS — J189 Pneumonia, unspecified organism: Secondary | ICD-10-CM

## 2011-05-10 HISTORY — DX: Pneumonia, unspecified organism: J18.9

## 2011-05-11 ENCOUNTER — Inpatient Hospital Stay (HOSPITAL_COMMUNITY)
Admission: EM | Admit: 2011-05-11 | Discharge: 2011-05-14 | DRG: 690 | Disposition: A | Discharge status: Home / Self Care | Payer: Medicare Other | Source: Ambulatory Visit | Attending: Internal Medicine | Admitting: Internal Medicine

## 2011-05-11 ENCOUNTER — Emergency Department (HOSPITAL_COMMUNITY): Payer: Medicare Other

## 2011-05-11 DIAGNOSIS — K219 Gastro-esophageal reflux disease without esophagitis: Secondary | ICD-10-CM | POA: Diagnosis present

## 2011-05-11 DIAGNOSIS — E039 Hypothyroidism, unspecified: Secondary | ICD-10-CM | POA: Diagnosis present

## 2011-05-11 DIAGNOSIS — D72829 Elevated white blood cell count, unspecified: Secondary | ICD-10-CM | POA: Diagnosis present

## 2011-05-11 DIAGNOSIS — M1710 Unilateral primary osteoarthritis, unspecified knee: Secondary | ICD-10-CM | POA: Diagnosis present

## 2011-05-11 DIAGNOSIS — M4120 Other idiopathic scoliosis, site unspecified: Secondary | ICD-10-CM | POA: Diagnosis present

## 2011-05-11 DIAGNOSIS — H9190 Unspecified hearing loss, unspecified ear: Secondary | ICD-10-CM | POA: Diagnosis present

## 2011-05-11 DIAGNOSIS — Z96659 Presence of unspecified artificial knee joint: Secondary | ICD-10-CM

## 2011-05-11 DIAGNOSIS — N400 Enlarged prostate without lower urinary tract symptoms: Secondary | ICD-10-CM | POA: Diagnosis present

## 2011-05-11 DIAGNOSIS — B952 Enterococcus as the cause of diseases classified elsewhere: Secondary | ICD-10-CM | POA: Diagnosis present

## 2011-05-11 DIAGNOSIS — E785 Hyperlipidemia, unspecified: Secondary | ICD-10-CM | POA: Diagnosis present

## 2011-05-11 DIAGNOSIS — R509 Fever, unspecified: Secondary | ICD-10-CM | POA: Diagnosis present

## 2011-05-11 DIAGNOSIS — N390 Urinary tract infection, site not specified: Principal | ICD-10-CM | POA: Diagnosis present

## 2011-05-11 DIAGNOSIS — I4891 Unspecified atrial fibrillation: Secondary | ICD-10-CM | POA: Diagnosis present

## 2011-05-11 DIAGNOSIS — Z79899 Other long term (current) drug therapy: Secondary | ICD-10-CM

## 2011-05-11 DIAGNOSIS — G609 Hereditary and idiopathic neuropathy, unspecified: Secondary | ICD-10-CM | POA: Diagnosis present

## 2011-05-11 DIAGNOSIS — Z7982 Long term (current) use of aspirin: Secondary | ICD-10-CM

## 2011-05-11 DIAGNOSIS — D649 Anemia, unspecified: Secondary | ICD-10-CM | POA: Diagnosis present

## 2011-05-11 DIAGNOSIS — R627 Adult failure to thrive: Secondary | ICD-10-CM | POA: Diagnosis present

## 2011-05-11 DIAGNOSIS — R4182 Altered mental status, unspecified: Secondary | ICD-10-CM | POA: Diagnosis present

## 2011-05-11 DIAGNOSIS — R351 Nocturia: Secondary | ICD-10-CM | POA: Diagnosis present

## 2011-05-11 DIAGNOSIS — I959 Hypotension, unspecified: Secondary | ICD-10-CM | POA: Diagnosis present

## 2011-05-11 DIAGNOSIS — R269 Unspecified abnormalities of gait and mobility: Secondary | ICD-10-CM | POA: Diagnosis present

## 2011-05-11 LAB — BASIC METABOLIC PANEL
CO2: 23 mEq/L (ref 19–32)
GFR calc non Af Amer: 78 mL/min — ABNORMAL LOW (ref >90)
Glucose, Bld: 120 mg/dL — ABNORMAL HIGH (ref 70–99)
Potassium: 3.5 mEq/L (ref 3.5–5.1)
Sodium: 131 mEq/L — ABNORMAL LOW (ref 135–145)

## 2011-05-11 LAB — CBC
Hemoglobin: 11.2 g/dL — ABNORMAL LOW (ref 13.0–17.0)
MCH: 29.9 pg (ref 26.0–34.0)
MCV: 85.3 fL (ref 78.0–100.0)
RBC: 3.74 MIL/uL — ABNORMAL LOW (ref 4.22–5.81)

## 2011-05-11 LAB — POCT I-STAT TROPONIN I: Troponin i, poc: 0 ng/mL (ref 0.00–0.08)

## 2011-05-11 LAB — DIFFERENTIAL
Basophils Relative: 0 % (ref 0–1)
Eosinophils Relative: 0 % (ref 0–5)
Lymphocytes Relative: 3 % — ABNORMAL LOW (ref 12–46)
Monocytes Relative: 5 % (ref 3–12)
Neutro Abs: 12.2 10*3/uL — ABNORMAL HIGH (ref 1.7–7.7)

## 2011-05-11 LAB — APTT: aPTT: 42 seconds — ABNORMAL HIGH (ref 24–37)

## 2011-05-11 LAB — PRO B NATRIURETIC PEPTIDE: Pro B Natriuretic peptide (BNP): 544.5 pg/mL — ABNORMAL HIGH (ref 0–450)

## 2011-05-12 ENCOUNTER — Inpatient Hospital Stay (HOSPITAL_COMMUNITY): Payer: Medicare Other

## 2011-05-12 LAB — CBC
HCT: 28.2 % — ABNORMAL LOW (ref 39.0–52.0)
Hemoglobin: 9.6 g/dL — ABNORMAL LOW (ref 13.0–17.0)
MCHC: 34 g/dL (ref 30.0–36.0)
RBC: 3.28 MIL/uL — ABNORMAL LOW (ref 4.22–5.81)

## 2011-05-12 LAB — COMPREHENSIVE METABOLIC PANEL
ALT: 19 U/L (ref 0–53)
Alkaline Phosphatase: 141 U/L — ABNORMAL HIGH (ref 39–117)
BUN: 18 mg/dL (ref 6–23)
CO2: 25 mEq/L (ref 19–32)
Chloride: 103 mEq/L (ref 96–112)
GFR calc Af Amer: >90 mL/min (ref >90)
GFR calc non Af Amer: 81 mL/min — ABNORMAL LOW (ref >90)
Glucose, Bld: 99 mg/dL (ref 70–99)
Potassium: 3.8 mEq/L (ref 3.5–5.1)
Sodium: 134 mEq/L — ABNORMAL LOW (ref 135–145)
Total Bilirubin: 0.8 mg/dL (ref 0.3–1.2)
Total Protein: 5.3 g/dL — ABNORMAL LOW (ref 6.0–8.3)

## 2011-05-12 LAB — TSH: TSH: 3.237 u[IU]/mL (ref 0.350–4.500)

## 2011-05-12 LAB — URINE MICROSCOPIC-ADD ON

## 2011-05-12 LAB — URINALYSIS, ROUTINE W REFLEX MICROSCOPIC
Nitrite: NEGATIVE
Protein, ur: 30 mg/dL — AB
Specific Gravity, Urine: 1.02 (ref 1.005–1.030)
Urobilinogen, UA: 2 mg/dL — ABNORMAL HIGH (ref 0.0–1.0)

## 2011-05-13 DIAGNOSIS — I359 Nonrheumatic aortic valve disorder, unspecified: Secondary | ICD-10-CM

## 2011-05-13 LAB — BASIC METABOLIC PANEL
BUN: 11 mg/dL (ref 6–23)
CO2: 22 mEq/L (ref 19–32)
Calcium: 8.4 mg/dL (ref 8.4–10.5)
Chloride: 104 mEq/L (ref 96–112)
Creatinine, Ser: 0.7 mg/dL (ref 0.50–1.35)
GFR calc Af Amer: >90 mL/min (ref >90)
GFR calc non Af Amer: 81 mL/min — ABNORMAL LOW (ref >90)
Glucose, Bld: 98 mg/dL (ref 70–99)
Potassium: 4 mEq/L (ref 3.5–5.1)
Sodium: 133 mEq/L — ABNORMAL LOW (ref 135–145)

## 2011-05-13 LAB — CBC
Hemoglobin: 10.6 g/dL — ABNORMAL LOW (ref 13.0–17.0)
Platelets: 301 10*3/uL (ref 150–400)
RBC: 3.57 MIL/uL — ABNORMAL LOW (ref 4.22–5.81)
WBC: 9 10*3/uL (ref 4.0–10.5)

## 2011-05-13 LAB — URINE CULTURE: Culture  Setup Time: 201210030042

## 2011-05-13 LAB — DIFFERENTIAL
Basophils Absolute: 0 10*3/uL (ref 0.0–0.1)
Eosinophils Relative: 2 % (ref 0–5)
Lymphocytes Relative: 13 % (ref 12–46)
Lymphs Abs: 1.1 10*3/uL (ref 0.7–4.0)
Neutro Abs: 6.7 10*3/uL (ref 1.7–7.7)
Neutrophils Relative %: 74 % (ref 43–77)

## 2011-05-13 NOTE — H&P (Signed)
NAMEMAR, WALMER                ACCOUNT NO.:  [id number]  MEDICAL RECORD NO.:  [id number]  LOCATION:  4707                         FACILITY:  [facility]  PHYSICIAN:  Tera Mater. [place], M.D. DATE OF BIRTH:  26-May-1918  DATE OF ADMISSION:  05/11/2011 DATE OF DISCHARGE:                             HISTORY & PHYSICAL   Mr. Spiering is a 77 year old white male with a history of prior atrial fibrillation, some emphysema, osteoarthritis, and hypothyroidism.  He has been feeling unwell for the last 7-10 days.  Today, he spiked a fever to 102 and had some confusion.  He was brought in to the emergency room and has some hypotension and what appears to be right pneumonia. He has really not had a lot pulmonary symptoms.  He ate lunch today a little bit of it.  He has been weak and just basically not acting like himself over the day.  He was confused a bit earlier here in the emergency room, now he is back to his normal.  He has no feeling of shortness of breath with his oxygen on.  He has no cardiac chest pain. He is in atrial fibrillation and has no sensation of that at the present.  He has not been in atrial fib recently.  He notes no aches other than his knee hurting some.  His bowels are working well.  He has not been eating and drinking very well as noted.  He denies any headache or weakness in any particular way.  Communication is a bit difficult due to his sensory neural hearing loss.  His wife and son help provide some of the history.  PAST MEDICAL HISTORY:  Includes hyperlipidemia.  He had a septic right lobe empyema with extended hospitalization with ventilation-dependent respiratory failure, PEG tube, and a long hospitalization.  He had atrial fibrillation at that time.  He has listed as having hypothyroidism, but is not treated.  He has scoliosis, peripheral neuropathy, hearing loss, BPH, gastroesophageal reflux, some nocturia, and has hypertension that has not required medication.   He has also had an anemia.  He has had his hemorrhoids out, bilateral hernia repair.  He has had a lung nodule in the past and had a video-assisted thoracoscopy when he had the empyema.  He has dysphasia of course as noted up above that was all back in 2008.  He has had Pseudomonas bacteremia in the past.  He had a normal ejection fraction in the past.  His potassium has been low at times.  He has had a knee replacement.  Medications include only flurbiprofen.  Allergies are listed as none.  SOCIAL HISTORY:  He is a retired Education officer, community.  He lives at home with his wife.  His son is in attendance.  PHYSICAL EXAMINATION:  VITAL SIGNS:  Here in the emergency room, blood pressure initially was 113/52, it dropped to 97/46 and came back up some with fluids, temperature here was as high as 102.4, pulse was as high as 104, respiration rate was 20, O2 sat was 98%. GENERAL:  We have an elderly but fairly nontoxic-appearing white male, sitting up with oxygen in place. HEENT:  Sclerae anicteric.  Face is symmetric.  Oral mucous membranes are moist. NECK:  Supple with some external JVD. LUNGS:  Diminished breath sounds with a few rhonchi.  No accessory muscles are in use. HEART:  Irregularly irregular and hyperdynamic. ABDOMEN:  Soft, nontender without masses. EXTREMITIES:  Revealed pulses to be relatively well-preserved with no edema. NEUROLOGIC:  The patient is awake.  He is alert.  His mentation at the present is good.  His speech is clear.  He is coherent and oriented.  He is asking if this is pneumococcal pneumonia.  He is quite hard of hearing.  No resting tremors present.  LAB DATA:  A chest x-ray is quite abnormal with a lot of chronic changes, there may be a right lower lobe infiltrate.  His troponin was 0.00.  White count is 13,300, hemoglobin is 11.2, platelets are 249,000. A BNP is minimally elevated at 544.5.  His sodium is 131, potassium 3.5, chloride 96, CO2 23, BUN 21, creatinine  0.75, glucose of 120, calcium 8.8.  PTT was 42, INR was 1.39.  An EKG shows a rate of 111.  There are no ischemic changes noted.  In summary, we have a 72 plus year old community-dwelling white male comes in with what appears to be community-acquired pneumonia.  Clearly, he has a high fever and some leukocytosis with no other obvious source at the present time.  Urinalysis is pending and could be an alternative source.  His white count is up somewhat.  He has responded well to a bit of fluids, but he was hypotensive when he came in.  I doubt that he has got a bacteremia.  He does not appear to have any signs of sepsis.  Mild altered mental status probably was due to the hypotension.  He has already received Avelox which is appropriate for this.  He needs some hydration and repeat of his labs.  I assume he had TSH done recently, but I do not have those labs in the office.  His cardiac markers are normal.  He will be admitted to telemetry floor.  I do not need to treat his atrial fib yet from a rhythm control standpoint.  He has previously been on beta-blockers, but I am going to avoid those unless he goes faster.  We will get Cardiology involved if this becomes a resistant problem.          ______________________________ Tera Mater Evlyn Kanner, M.D.     SAS/MEDQ  D:  05/11/2011  T:  05/12/2011  Job:  [id number]  Electronically Signed by Adrian Prince M.D. on 05/13/2011 86:57:84 PM

## 2011-05-18 LAB — CULTURE, BLOOD (ROUTINE X 2): Culture  Setup Time: 201210030717

## 2011-05-27 LAB — COMPREHENSIVE METABOLIC PANEL
AST: 38 — ABNORMAL HIGH
Albumin: 2.3 — ABNORMAL LOW
BUN: 18
Calcium: 8.2 — ABNORMAL LOW
Creatinine, Ser: 0.86
GFR calc Af Amer: >60
GFR calc non Af Amer: >60

## 2011-05-27 LAB — PROTIME-INR
INR: 2.5 — ABNORMAL HIGH
INR: 3 — ABNORMAL HIGH
Prothrombin Time: 28.4 — ABNORMAL HIGH
Prothrombin Time: 32.5 — ABNORMAL HIGH
Prothrombin Time: 33.2 — ABNORMAL HIGH

## 2011-05-27 LAB — URINE MICROSCOPIC-ADD ON: RBC / HPF: NONE SEEN

## 2011-05-27 LAB — URINE CULTURE: Colony Count: >=100000

## 2011-05-27 LAB — URINALYSIS, ROUTINE W REFLEX MICROSCOPIC
Bilirubin Urine: NEGATIVE
Hgb urine dipstick: NEGATIVE
Nitrite: POSITIVE — AB
Protein, ur: NEGATIVE
Specific Gravity, Urine: 1.014 (ref 1.005–1.035)
Urobilinogen, UA: 0.2

## 2011-06-05 NOTE — Discharge Summary (Signed)
[name], [name] NO.:  [id number]  MEDICAL RECORD NO.:  [id number]  LOCATION:  4707                         FACILITY:  [facility]  PHYSICIAN:  Gerald Pounds, MD       DATE OF BIRTH:  January 26, 1918  DATE OF ADMISSION:  05/11/2011 DATE OF DISCHARGE:                              DISCHARGE SUMMARY   PRIMARY CARE PROVIDER:  Gwen Pounds, MD  DISCHARGE DIAGNOSES: 1. Enterococcal urinary tract infection with acute illness. 2. Pneumonia appears to have been ruled out. 3. Fever, leukocytosis, and hypotension resolved. 4. Altered mental state, resolved. 5. Failure to thrive, generalized weakness, and fall risk. 6. Known paroxysmal atrial fibrillation now back in sinus rhythm with     premature atrial contraction and premature ventricular contraction. 7. Anemia.  Last hemoglobin 10.6 on October 4. 8. Hypothyroid but this is subclinical and his TSH currently is 3.2. 9. Severe hard of hearing. 10.Wax status post Debrox treatment. 11.Benign prostatic hypertrophy. 12.Hyperlipidemia not needing treatment. 13.Gait disorder. 14.Degenerative joint disease in both knees, severe, status post total     knee replacement. 15.Gastroesophageal reflux disease. 16.Significant nocturia. 17.Scoliosis of left leg. 18.Peripheral neuropathy. 19.Significant admission in April to May 2008 for atrial fibrillation,     Pseudomonas bacteremia, sepsis, right lobe empyema status post     VATS, chest tube, dysphagia at that time, dysphagia at that time. 20.History of bilateral inguinal hernias. 21.History of right eye cataract.  DISCHARGE MEDICATIONS: 1. Acetaminophen 500 mg extra strength 1-2 tablets by mouth every 6-8     hours as needed for pain. 2. Aspirin 81 mg p.o. daily. 3. Ampicillin 250 mg p.o. q.6 hours for 5 days, #20. 4. Flurbiprofen 1 tablet by mouth b.i.d. p.r.n. significant pain. 5. Cod liver oil over-the-counter 1 capsule by mouth daily. 6. Multivitamin 1 p.o.  daily  AFTERCARE FOLLOWUP INSTRUCTIONS:  He is to follow up with me in 2-3 weeks in my office for postop hospital evaluation and repeat labs.  DISCHARGE PROCEDURES: 1. Multiple chest x-rays with the last chest x-ray on October 3     revealing no evidence for pneumonia, stable compared to prior exam. 2. EKG done in the emergency room for some reason is not easily found     on the chart but by report showed atrial fibrillation.  He has been     on telemetry and currently is in normal sinus rhythm with PACs and     PVCs. 3. Hospital medical management. 4. A 2-D echocardiogram revealing EF 55-60%, LVH, mild AI, and mild     MR. 5. Physical therapy.  HISTORY OF PRESENT ILLNESS:  Briefly, [name] is a 77 year old male, about to be 60 next week with prior AFib and known arthritis and multiple other minor issues who has been doing poor for 7-10 days prior to admission.  On the day of admission, he spiked a fever of 102 , had confusion, hypotension.  Emergency room workup was unremarkable and the presumptive diagnosis of community-acquired pneumonia was entertained. He was given IV antibiotics and admitted to the floor.  He was seen by Dr. Evlyn Hunt in the emergency room on hospital day #1.  Please  see his H and P and note for further details.  His symptoms included mild anorexia.  Blood pressure was 97/46.  X-ray showed chronic changes.  CK and troponin were negative.  White count was 13.3, hemoglobin 11.2, BNP was 544.  Sodium was 131, creatinine was 0.75.  HOSPITAL COURSE: 1. [name] is a 77 year old male who presented with fever of 102,     leukocytosis of 13.3, and mild hypotension to the emergency     department.  Workup in the emergency room was unclear and community-     acquired pneumonia was the initial diagnosis entertained.     Urinalysis was pending at the time of admission.  He was placed on     IV antibiotics, given IV fluids.  Other labs were obtained.     Cardiac  markers were obtained and were normal and he was admitted     to telemetry floor based on the fact that he had AFib on the EKG.     Overnight he defervesced.  His white count came down.  He felt much     better.  He eventually converted over to normal sinus rhythm with     PACs and PVCs.  Urinalysis did come back positive.  A urine culture     came back with enterococcus.  The good news is it was sensitive to     vancomycin, ampicillin, and Macrodantin.  He was given 2 doses of     IV vancomycin in the hospital and he will be discharged on oral 5-     day course of ampicillin.  He is already markedly better and     whatever antibiotics he has already received eradicated the     infection and improved clinical course. 2. For his fever and urinary tract infection, he will finish out     antibiotics. 3. He does not have pneumonia, although that was the initial diagnosis     that was presumptive. 4. For his fevers and failure to thrive, generalized weakness and fall     risk, the PT and OT were ordered yesterday morning, still have not     been done, it will be done today prior to discharge. 5. As far as his confusion, he is back to baseline. 6. As far as his leukocytosis, it is better.  White count 13.3 to 9.5     to 9.0. 7. As far as AFib, he is back in sinus rhythm.  Because of his fall     risk, he is very poor Coumadin candidate.  We will go ahead and     continue him on aspirin alone and he does not need any beta-     blockers or calcium channel blockers as his blood pressures usually     fluctuate between systolic blood pressure of 100-130/60s-70s.  His     hypertension is better after fluids and the fluids have been     stopped. 8. He remained on DVT prophylaxis. 9. Hi anemia is stable and will need to be followed up as an     outpatient.  His hemoglobin last checked was 10.6. 10.For his underlying hypothyroidism, TSH was checked in the hospital.     It was 3.2 and stable. 11.For  his very hard of hearing, he does wear hearing aids.  Family     was insistent and adamant that we clean out his ears.  We put some     Debrox and it actually helped  pretty dramatically. 12.For the AFib, he did undergo 2-D echo with report listed above.  He was seen in the morning of May 14, 2011.  The Debrox helped.  He had no complaints.  He is a little bit weak.  He has got no new complaints.  All his vital signs are stable.  His blood pressure is 122/74.  His sats are 97% on room air.  He is frail and weak, but looks up to be at his baseline.  He is wearing  __________ and he will be taken off.  His telemetry shows PACs and PVCs in normal sinus rhythm. His physical exam and mental exam is at baseline.  For me it is okay to discharge him home on current treatments for the urinary tract infection after his PT evaluation and treatment and 1 more dose of vancomycin is given.     Gerald Pounds, MD     JMR/MEDQ  D:  05/14/2011  T:  05/14/2011  Job:  [id number]  cc:   Tera Mater. [name], M.D.  Electronically Signed by Creola Corn MD on 06/05/2011 09:24:33 AM

## 2011-06-20 ENCOUNTER — Inpatient Hospital Stay (HOSPITAL_COMMUNITY)
Admission: EM | Admit: 2011-06-20 | Discharge: 2011-06-25 | DRG: 378 | Disposition: A | Discharge status: Home Health | Payer: Medicare Other | Attending: Internal Medicine | Admitting: Internal Medicine

## 2011-06-20 ENCOUNTER — Encounter: Payer: Self-pay | Admitting: *Deleted

## 2011-06-20 DIAGNOSIS — E876 Hypokalemia: Secondary | ICD-10-CM | POA: Diagnosis present

## 2011-06-20 DIAGNOSIS — H9190 Unspecified hearing loss, unspecified ear: Secondary | ICD-10-CM | POA: Diagnosis present

## 2011-06-20 DIAGNOSIS — D649 Anemia, unspecified: Secondary | ICD-10-CM | POA: Diagnosis present

## 2011-06-20 DIAGNOSIS — G609 Hereditary and idiopathic neuropathy, unspecified: Secondary | ICD-10-CM | POA: Diagnosis present

## 2011-06-20 DIAGNOSIS — M4120 Other idiopathic scoliosis, site unspecified: Secondary | ICD-10-CM | POA: Diagnosis present

## 2011-06-20 DIAGNOSIS — K922 Gastrointestinal hemorrhage, unspecified: Secondary | ICD-10-CM

## 2011-06-20 DIAGNOSIS — K921 Melena: Secondary | ICD-10-CM

## 2011-06-20 DIAGNOSIS — K5731 Diverticulosis of large intestine without perforation or abscess with bleeding: Principal | ICD-10-CM | POA: Diagnosis present

## 2011-06-20 DIAGNOSIS — D638 Anemia in other chronic diseases classified elsewhere: Secondary | ICD-10-CM | POA: Diagnosis present

## 2011-06-20 DIAGNOSIS — I498 Other specified cardiac arrhythmias: Secondary | ICD-10-CM | POA: Diagnosis present

## 2011-06-20 DIAGNOSIS — D62 Acute posthemorrhagic anemia: Secondary | ICD-10-CM | POA: Diagnosis present

## 2011-06-20 DIAGNOSIS — E039 Hypothyroidism, unspecified: Secondary | ICD-10-CM | POA: Diagnosis present

## 2011-06-20 DIAGNOSIS — E785 Hyperlipidemia, unspecified: Secondary | ICD-10-CM | POA: Diagnosis present

## 2011-06-20 HISTORY — DX: Other complications of anesthesia, initial encounter: T88.59XA

## 2011-06-20 HISTORY — DX: Pneumonia, unspecified organism: J18.9

## 2011-06-20 HISTORY — DX: Anemia, unspecified: D64.9

## 2011-06-20 HISTORY — DX: Peripheral vascular disease, unspecified: I73.9

## 2011-06-20 HISTORY — DX: Adverse effect of unspecified anesthetic, initial encounter: T41.45XA

## 2011-06-20 HISTORY — DX: Cardiac arrhythmia, unspecified: I49.9

## 2011-06-20 LAB — DIFFERENTIAL
Basophils Relative: 0 % (ref 0–1)
Eosinophils Absolute: 0.1 10*3/uL (ref 0.0–0.7)
Eosinophils Relative: 3 % (ref 0–5)
Lymphs Abs: 1.4 10*3/uL (ref 0.7–4.0)
Monocytes Absolute: 0.5 10*3/uL (ref 0.1–1.0)
Monocytes Relative: 10 % (ref 3–12)
Neutrophils Relative %: 62 % (ref 43–77)

## 2011-06-20 LAB — COMPREHENSIVE METABOLIC PANEL
Albumin: 2.8 g/dL — ABNORMAL LOW (ref 3.5–5.2)
Alkaline Phosphatase: 49 U/L (ref 39–117)
BUN: 29 mg/dL — ABNORMAL HIGH (ref 6–23)
CO2: 23 mEq/L (ref 19–32)
Chloride: 107 mEq/L (ref 96–112)
Creatinine, Ser: 0.81 mg/dL (ref 0.50–1.35)
GFR calc non Af Amer: 75 mL/min — ABNORMAL LOW (ref >90)
Potassium: 3.6 mEq/L (ref 3.5–5.1)
Total Bilirubin: 0.5 mg/dL (ref 0.3–1.2)

## 2011-06-20 LAB — CBC
HCT: 20.7 % — ABNORMAL LOW (ref 39.0–52.0)
Hemoglobin: 7.1 g/dL — ABNORMAL LOW (ref 13.0–17.0)
MCH: 30.6 pg (ref 26.0–34.0)
MCHC: 34.3 g/dL (ref 30.0–36.0)
MCV: 89.2 fL (ref 78.0–100.0)
RBC: 2.32 MIL/uL — ABNORMAL LOW (ref 4.22–5.81)

## 2011-06-20 LAB — PREPARE RBC (CROSSMATCH)

## 2011-06-20 LAB — PROTIME-INR: INR: 1.22 (ref 0.00–1.49)

## 2011-06-20 MED ORDER — SODIUM CHLORIDE 0.9 % IV SOLN
INTRAVENOUS | Status: DC
  Administered 2011-06-20: 10:00:00 via INTRAVENOUS

## 2011-06-20 MED ORDER — PANTOPRAZOLE SODIUM 40 MG IV SOLR
40.0000 mg | INTRAVENOUS | Status: DC
  Administered 2011-06-20 – 2011-06-22 (×3): 40 mg via INTRAVENOUS
  Filled 2011-06-20 (×4): qty 40

## 2011-06-20 MED ORDER — ACETAMINOPHEN 325 MG PO TABS: 650.0000 mg | ORAL_TABLET | Freq: Four times a day (QID) | ORAL | Status: DC | PRN

## 2011-06-20 MED ORDER — SODIUM CHLORIDE 0.9 % IV SOLN: INTRAVENOUS | Status: AC

## 2011-06-20 MED ORDER — ZOLPIDEM TARTRATE 5 MG PO TABS: 5.0000 mg | ORAL_TABLET | Freq: Every evening | ORAL | Status: DC | PRN

## 2011-06-20 MED ORDER — ACETAMINOPHEN 650 MG RE SUPP: 650.0000 mg | Freq: Four times a day (QID) | RECTAL | Status: DC | PRN

## 2011-06-20 NOTE — ED Notes (Signed)
Pt in c/o rectal bleeding, pt states he went to see his PMD and was given suppositories to try and help symptoms, pt states tonight he noted a mix of dark and bright red blood in bowel movement, states bleeding increased tonight

## 2011-06-20 NOTE — Plan of Care (Signed)
Problem: Consults Goal: GI Bleeding Patient Education See Patient Education Module for education specifics. Outcome: Progressing Education provided to family and pt--blood transfusion information provided

## 2011-06-20 NOTE — Plan of Care (Signed)
Problem: Phase I Progression Outcomes Goal: Hemodynamically stable Outcome: Progressing Pt receiving prbc's for hgb 7.1

## 2011-06-20 NOTE — ED Notes (Signed)
Pt states he felt like he had to pass gas last night and at that time he passed a large amount of bright red blood, with dark blood,  No stool noted only blood,  Pt says he originally started bleeding on Friday and went to his PCP and they did blood work no change in CBC from previous study,  Per family and pt the doctor was afraid to do internal exam so they prescribed suppository to soften stool.  Pt is alert and oriented he is from home,  He is very HOH,  Hearing aid in right ear.

## 2011-06-20 NOTE — ED Provider Notes (Signed)
History     CSN: [id number] Arrival date & time: 06/20/2011  3:58 AM   First MD Initiated Contact with Patient 06/20/11 [phone]      Chief Complaint  Patient presents with  . Rectal Bleeding    (Consider location/radiation/quality/duration/timing/severity/associated sxs/prior treatment) Patient is a 77 y.o. male presenting with hematochezia. The history is provided by the patient.  Rectal Bleeding  The current episode started today (The patient had an episode on Friday and then again this morning.). The patient is experiencing no pain. The stool is described as mixed with blood. Pertinent negatives include no abdominal pain, no diarrhea, no nausea, no vomiting, no chest pain, no headaches and no rash.   patient had rectal bleeding on Friday. She went to see his PCP, who gave him a suppository. This morning he had bright red blood and dark blood mixed with his bowel movement. He states he has no pain. He's had no other bleeding. Besides Friday has not had previous episodes of rectal bleeding. The lightheadedness or dizziness. He's not on any blood thinners. No fevers. No abdominal pain.  History reviewed. No pertinent past medical history.  History reviewed. No pertinent past surgical history.  History reviewed. No pertinent family history.  History  Substance Use Topics  . Smoking status: Never Smoker   . Smokeless tobacco: Not on file  . Alcohol Use: No      Review of Systems  Constitutional: Negative for activity change and appetite change.  HENT: Negative for neck stiffness.   Eyes: Negative for pain.  Respiratory: Negative for chest tightness and shortness of breath.   Cardiovascular: Negative for chest pain and leg swelling.  Gastrointestinal: Positive for blood in stool, hematochezia and anal bleeding. Negative for nausea, vomiting, abdominal pain and diarrhea.  Genitourinary: Negative for flank pain.  Musculoskeletal: Negative for back pain.  Skin: Negative for rash.    Neurological: Negative for weakness, numbness and headaches.  Psychiatric/Behavioral: Negative for behavioral problems.    Allergies  Review of patient's allergies indicates no known allergies.  Home Medications   Current Outpatient Rx  Name Route Sig Dispense Refill  . FLURBIPROFEN 50 MG PO TABS Oral Take 50 mg by mouth 2 (two) times daily.        BP 108/64  Pulse 77  Temp(Src) 98.2 F (36.8 C) (Oral)  Resp 16  SpO2 98%  Physical Exam  Nursing note and vitals reviewed. Constitutional: He is oriented to person, place, and time. He appears well-developed and well-nourished.  HENT:  Head: Normocephalic and atraumatic.  Cardiovascular: Normal rate, regular rhythm and normal heart sounds.   No murmur heard. Pulmonary/Chest: Effort normal and breath sounds normal.  Abdominal: Soft. He exhibits mass. He exhibits no distension. There is no tenderness. There is no rebound and no guarding.  Musculoskeletal: Normal range of motion. He exhibits no edema.  Neurological: He is alert and oriented to person, place, and time. No cranial nerve deficit.  Skin: Skin is warm and dry.  Psychiatric: He has a normal mood and affect.    ED Course  Procedures (including critical care time)  Labs Reviewed  CBC - Abnormal; Notable for the following:    RBC 2.32 (*)    Hemoglobin 7.1 (*)    HCT 20.7 (*)    Platelets 137 (*)    All other components within normal limits  PROTIME-INR - Abnormal; Notable for the following:    Prothrombin Time 15.7 (*)    All other components within normal limits  APTT - Abnormal; Notable for the following:    aPTT 40 (*)    All other components within normal limits  COMPREHENSIVE METABOLIC PANEL - Abnormal; Notable for the following:    BUN 29 (*)    Total Protein 5.0 (*)    Albumin 2.8 (*)    GFR calc non Af Amer 75 (*)    GFR calc Af Amer 87 (*)    All other components within normal limits  DIFFERENTIAL  POCT OCCULT BLOOD STOOL, DEVICE  TYPE AND  SCREEN  RED CROSS HLA ABC TYPING  PREPARE RBC (CROSSMATCH)   No results found.   1. GI bleed   2. Anemia associated with acute blood loss       MDM  Patient came to the ER for GI bleeding. He was seen on Friday by his PCP for the same. He has had increased bleeding today. No pain. He has a hemoglobin of 7. His Frank red blood on his rectal exam. He is on no blood thinners. He'll be admitted to Dr. Timothy Lasso. [facility] has been [facility].        Juliet Rude. Rubin Payor, MD 06/20/11 1044

## 2011-06-20 NOTE — H&P (Signed)
[name], [name]                ACCOUNT NO.:  [id number]  MEDICAL RECORD NO.:  [id number]  LOCATION:  WOTF                         FACILITY:  [facility]  PHYSICIAN:  Gaspar Garbe, M.D.DATE OF BIRTH:  09/27/1917  DATE OF ADMISSION:  06/20/2011 DATE OF DISCHARGE:                             HISTORY & PHYSICAL   CHIEF COMPLAINT:  Lower GI bleed.  HISTORY OF PRESENT ILLNESS:  Patient is a 77 year old gentleman who is followed by Dr. Timothy Lasso at [facility].  I saw him in coverage for Dr. Timothy Lasso on Friday when he came in with several bowel movements with bright red blood in them, which has seemingly stopped by the time of his appointment, which was about noon.  He had a hemoglobin and hematocrit done at that time, which showed a hemoglobin of 10.5, which was in line with his prior labs 2 weeks before at 10.6.  His exam was unremarkable.  He was not having any abdominal tenderness and in fact, has not had a bowel movement with blood in it for several hours. His rectal exam showed some dried red blood with clots and the presence of possibility of a hemorrhoidal area, which was nontender as well. Patient was taken off his aspirin and nonsteroidals, to use Tylenol only for pain, and was given a steroid suppository for twice daily use.  He was also instructed if he had recurrent bleeding, to contact the office or to go directly to the emergency room.  At about 3 this morning, the patient started to have more bright red blood per rectum and was taken to the [facility] ER where his hemoglobin had dropped into the 7s.  I was called and asked to admit the patient.  The patient continues to not have any pain and other than for the bleeding feels that he is at his baseline.  He is attended by his son who in fact, I saw with him at his visit.  They were commended on the following medical directions and we indicated that he would be a candidate most likely for colonoscopy coming  up.  ALLERGIES:  No known drug allergies.  He has had intolerance to NAPROSYN in the past.  MEDICATIONS: 1. Fish oil one tablet by mouth. 2. Flurbiprofen 100 mg one tablet twice daily, held since Friday. 3. Multivitamin once daily. 4. Saw palmetto 450 mg once daily. 5. Vitamin D 1000 units once daily. 6. Aspirin 81 mg once daily, held. 7. Proctocort 30 mg suppository one p.r. b.i.d., started on Friday.  PAST MEDICAL HISTORY: 1. Chronic anemia. 2. Hypothyroidism. 3. Hyperlipidemia. 4. Scoliosis. 5. Shortened left leg. 6. Peripheral neuropathy. 7. Hearing loss. 8. Atrial fibrillation in 2008. 9. History of Pseudomonas bacteremia and sepsis. 10.Right lobe emphysema, status post VATS and chest tube. 11.History of dysphagia with prolonged PEG tube feedings in 2008. 12.Bilateral inguinal hernias. 13.Right eye cataract. 14.GERD. 15.Nocturia. 16.Mild neuropathy. 17.Admission in October 2012 for Enterococcal urinary tract infection.  PAST SURGICAL HISTORY: 1. VATS, 2008. 2. Prostate biopsy, negative. 3. Right eye lens replacement. 4. Right total knee replacement.  SOCIAL HISTORY:  Patient has been a patient at [facility] since 1985.  He is married  with 4 children and 8 grandchildren.  His son is also a Education officer, community.  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  He does not smoke and drinks alcohol occasionally.  There is no illicit drug use to history.  FAMILY HISTORY:  Family history of longevity with father dying at age 76, mother dying at age 36.  He has no issues with hemochromatosis, but his children are healthy except for one with a bicuspid aortic valve.  REVIEW OF SYSTEMS:  Essentially negative x12 points.  Except for a lower GI bleed, he does not feel weak or short of breath or have chest pain.  PHYSICAL EXAMINATION:  VITAL SIGNS:  Temperature 97.2, pulse 70, respiratory rate 18, blood pressure 125/56, satting 98% on room air. GENERAL:  No acute  distress. HEENT:  Normocephalic, atraumatic.  PERRLA, EOMI.  ENT is within normal limits.  NECK:  Supple.  No lymphadenopathy, JVD, or bruit. HEART:  Regular rate and rhythm.  No murmur, rub, or gallop. LUNGS:  Clear to auscultation bilaterally. ABDOMEN:  Soft, nontender, normoactive bowel sounds.  He is guaiac positive from below with bright red blood noted on exam. EXTREMITIES:  No clubbing, cyanosis, or edema. NEURO:  Patient is oriented to person, place, and time, but has some difficulty with poor hearing.  LABORATORY TESTS:  Show a hemoglobin of 7.1, hematocrit 20.7, and platelet count 137.  BUN is 29, creatinine 0.8, INR is normal at 1.22.  IMAGING:  Not performed.  ASSESSMENT/PLAN: 1. Lower gastrointestinal bleed.  I spoke to them at length about our     plans for possible colonoscopy as an outpatient.  This will not     become an inpatient endeavor without GI has been contacted as Dr.     Christella Hartigan was involved with placement of his PEG tube back in 2008 and     he has rapport with this group and will be coming to seeing the     patient and I would assume be performing some sort of lower     procedure whether a colonoscopy or flexible sigmoid, most likely     the former.  He is NPO.  His only medications are essentially as     needed or vitamin supplements.  Therefore, he is not taking     anything and he has been very diligent and holding his aspirin and     nonsteroidal as well.  Will continue to hold these.  Patient has     been placed on IV Protonix and IV fluids.  He also has been written     for transfusion of 2 units of packed red blood cells per the ER,     which has not been done yet.  We will check his blood counts 4     hours post-transfusion and again in the morning. 2. Arthritis.  Tylenol for pain only.  He is essentially pain-free at     this point. 3. Please see stated-above past medical history, none are currently     active.     Gaspar Garbe,  M.D.     RWT/MEDQ  D:  06/20/2011  T:  06/20/2011  Job:  [id number]

## 2011-06-20 NOTE — Progress Notes (Signed)
Referring Provider: No ref. provider found Primary Care Physician:  Gwen Pounds, MD, MD Primary Gastroenterologist:  Dr.  Jaquita Rector for Consultation:  **Hematochezia*  HPI: [name] is a 77 y.o. male **referred at the request of Dr. Timothy Lasso for evaluation of painless hematochezia. 2 days ago he developed prior of blood per rectum. This has happened multiple times leading to the day of admission. He is without antecedent change in bowel habits. He takes flurbiprofen intermittently. There is no history of peptic ulcer disease.*   Past medical history is pertinent for intermittent atrial fibrillation  History reviewed. No pertinent past surgical history.  Prior to Admission medications   Medication Sig Start Date End Date Taking? Authorizing Provider  flurbiprofen (ANSAID) 50 MG tablet Take 50 mg by mouth 2 (two) times daily.     Yes Historical Provider, MD    Current Facility-Administered Medications  Medication Dose Route Frequency Provider Last Rate Last Dose  . 0.9 %  sodium chloride infusion   Intravenous STAT Juliet Rude. Pickering, MD      . pantoprazole (PROTONIX) injection 40 mg  40 mg Intravenous Q24H Gaspar Garbe, MD      . DISCONTD: 0.9 %  sodium chloride infusion   Intravenous Continuous Juliet Rude. Rubin Payor, MD 125 mL/hr at 06/20/11 1610     Current Outpatient Prescriptions  Medication Sig Dispense Refill  . flurbiprofen (ANSAID) 50 MG tablet Take 50 mg by mouth 2 (two) times daily.          Allergies as of 06/20/2011  . (No Known Allergies)    History reviewed. No pertinent family history.  History   Social History  . Marital Status: Married    Spouse Name: N/A    Number of Children: N/A  . Years of Education: N/A   Occupational History  . Not on file.   Social History Main Topics  . Smoking status: Never Smoker   . Smokeless tobacco: Not on file  . Alcohol Use: No  . Drug Use: No  . Sexually Active:    Other Topics Concern  . Not on file    Social History Narrative  . No narrative on file    Review of Systems: He complains of knee pain. Pertinent positive and negative review of systems were noted in the above history of present illness section. All other review of systems were otherwise negative   Physical Exam: Vital signs in last 24 hours: Temp:  [97.2 F (36.2 C)-98.2 F (36.8 C)] 97.2 F (36.2 C) (11/11 1150) Pulse Rate:  [70-89] 70  (11/11 1150) Resp:  [16-20] 18  (11/11 1150) BP: (108-125)/(50-64) 125/56 mmHg (11/11 1150) SpO2:  [98 %-100 %] 98 % (11/11 1150)  General:   Alert,  Well-developed, well-nourished, pleasant and cooperative in NAD Head:  Normocephalic and atraumatic. Eyes:  Sclera clear, no icterus.   Conjunctiva pink. Ears:  Normal auditory acuity. Nose:  No deformity, discharge,  or lesions. Mouth:  No deformity or lesions.  Oropharynx pink & moist. Neck:  Supple; no masses or thyromegaly. Lungs:  Clear throughout to auscultation.   No wheezes, crackles, or rhonchi. No acute distress. Heart:  Regular rate and rhythm; no murmurs, clicks, rubs,  or gallops. Abdomen:  Soft, nontender and nondistended. No masses, hepatosplenomegaly or hernias noted. Normal bowel sounds, without guarding, and without rebound.   Rectal:  Deferred until time of colonoscopy.   Msk:  Symmetrical without gross deformities. Normal posture. Pulses:  Normal pulses noted. Extremities:  Without  clubbing or edema. Neurologic:  Alert and  oriented x4;  grossly normal neurologically. Skin:  Intact without significant lesions or rashes. Cervical Nodes:  No significant cervical adenopathy. Psych:  Alert and cooperative. Normal mood and affect.  Intake/Output from previous day:   Intake/Output this shift: Total I/O In: -  Out: 750 [Urine:750]  Lab Results:  [facility] 06/20/11 0905  WBC 5.3  HGB 7.1*  HCT 20.7*  PLT 137*   BMET  Basename 06/20/11 0905  NA 138  K 3.6  CL 107  CO2 23  GLUCOSE 96  BUN 29*   CREATININE 0.81  CALCIUM 8.7   LFT  Basename 06/20/11 0905  PROT 5.0*  ALBUMIN 2.8*  AST 23  ALT 13  ALKPHOS 49  BILITOT 0.5  BILIDIR --  IBILI --   PT/INR  Basename 06/20/11 0905  LABPROT 15.7*  INR 1.22   Hepatitis Panel No results found for this basename: HEPBSAG,HCVAB,HEPAIGM,HEPBIGM in the last 72 hours Additional Labs **None*  Studies/Results: No results found.  Impression/Plan: .1) Acute GI Bleed  Clinically this is more likely a lower GI bleeding source; rule out diverticular bleeding versus AVM or neoplasm. At the same time patient has been taking NSAIDs intermittently which increases his risk for ulcerations anywhere along the tract tract including active peptic ulcer disease. Recommendations IV Protonix Colonoscopy; if negative proceed with upper endoscopy. This will be deferred until it appears that his acute bleeding has subsided**  #2 anemia. This is due to acute GI blood loss probably superimposed upon anemia of chronic disease. Recommendations Transfuse to hemoglobin 8-9    Barbette Hair. Arlyce Dice, MD, [facility] [phone]    06/20/2011, 1:28 PM

## 2011-06-20 NOTE — ED Notes (Signed)
Pt has on brief,  Brief observed for bleeding,  Only minimal streaks noted

## 2011-06-20 NOTE — Plan of Care (Signed)
Problem: Phase I Progression Outcomes Goal: OOB as tolerated unless otherwise ordered Outcome: Not Progressing Needs one to person assist when up--total assist with adl's

## 2011-06-21 DIAGNOSIS — D62 Acute posthemorrhagic anemia: Secondary | ICD-10-CM

## 2011-06-21 DIAGNOSIS — K922 Gastrointestinal hemorrhage, unspecified: Secondary | ICD-10-CM

## 2011-06-21 LAB — COMPREHENSIVE METABOLIC PANEL
Albumin: 2.8 g/dL — ABNORMAL LOW (ref 3.5–5.2)
BUN: 17 mg/dL (ref 6–23)
CO2: 23 mEq/L (ref 19–32)
Chloride: 109 mEq/L (ref 96–112)
Creatinine, Ser: 0.87 mg/dL (ref 0.50–1.35)
GFR calc Af Amer: 85 mL/min — ABNORMAL LOW (ref >90)
GFR calc non Af Amer: 73 mL/min — ABNORMAL LOW (ref >90)
Glucose, Bld: 91 mg/dL (ref 70–99)
Total Bilirubin: 1.1 mg/dL (ref 0.3–1.2)

## 2011-06-21 LAB — CBC
Platelets: 147 10*3/uL — ABNORMAL LOW (ref 150–400)
RBC: 3.03 MIL/uL — ABNORMAL LOW (ref 4.22–5.81)
RDW: 15.5 % (ref 11.5–15.5)
WBC: 5.9 10*3/uL (ref 4.0–10.5)

## 2011-06-21 LAB — PREPARE RBC (CROSSMATCH)

## 2011-06-21 LAB — HEMOGLOBIN AND HEMATOCRIT, BLOOD: Hemoglobin: 10.7 g/dL — ABNORMAL LOW (ref 13.0–17.0)

## 2011-06-21 LAB — PROTIME-INR: INR: 1.25 (ref 0.00–1.49)

## 2011-06-21 MED ORDER — PEG-KCL-NACL-NASULF-NA ASC-C 100 G PO SOLR
1.0000 | Freq: Once | ORAL | Status: AC
  Administered 2011-06-21: 100 g via ORAL
  Filled 2011-06-21: qty 1

## 2011-06-21 MED ORDER — SODIUM CHLORIDE 0.9 % IV SOLN
INTRAVENOUS | Status: DC
  Administered 2011-06-21 – 2011-06-22 (×3): via INTRAVENOUS

## 2011-06-21 MED ORDER — POTASSIUM CHLORIDE 10 MEQ/100ML IV SOLN
10.0000 meq | INTRAVENOUS | Status: AC
  Administered 2011-06-21 (×4): 10 meq via INTRAVENOUS
  Filled 2011-06-21 (×4): qty 100

## 2011-06-21 NOTE — Progress Notes (Signed)
  [name] had 2 episodes around lunchtime today of large-volume grossly bloody stools with clots. He has not had any bleeding now over the past 3 hours. He has remained hemodynamically stable. His last hemoglobin was 9.5 however am concerned that as did not reflect these last episodes of bleeding.  Patient is being transfused 2 units of packed RBCs am have placed a second IV, and transferred patient to step down for closer monitoring. Have a rediscussed colonoscopy with the patient and at this time he is very agreeable to proceeding.  Will start prepping him this evening slowly, and have scheduled him for colonoscopy with Dr. Leone Payor in the a.m. tomorrow. If colonoscopy is unrevealing we'll proceed with EGD. I have met with and discussed his management with his wife and 2 other family members. He was on a baby aspirin at home prior to admission and also on fluoroscopy the probe and which was stopped 4 days ago. Wonder if this combination exacerbated his bleed.

## 2011-06-21 NOTE — Progress Notes (Signed)
Given these changes will plan for colonoscopy +/- EGD tomorrow. I have explained the risks, benefits and alternatives and potential for success (likely only diagnostic but possibly therapeutic).  Suspect diverticular bleed but could be other more treatable cause.

## 2011-06-21 NOTE — Progress Notes (Signed)
Subjective: Has not had any BM's overnight, CBC up appropriately.  No pain, just urination.  Objective: Vital signs in last 24 hours: Temp:  [97.2 F (36.2 C)-98.3 F (36.8 C)] 98 F (36.7 C) (11/12 0530) Pulse Rate:  [70-95] 81  (11/12 0530) Resp:  [16-20] 18  (11/12 0530) BP: (103-138)/(53-83) 114/64 mmHg (11/12 0530) SpO2:  [93 %-100 %] 98 % (11/12 0530) Weight:  [64.864 kg (143 lb)] 143 lb (64.864 kg) (11/11 1448) Weight change:  Last BM Date: 06/20/11  Intake/Output from previous day: 11/11 0701 - 11/12 0700 In: 2805 [I.V.:2160; Blood:645] Out: 3125 [Urine:3125] Intake/Output this shift:    General appearance: alert and cooperative Neck: no adenopathy, no carotid bruit, no JVD, supple, symmetrical, trachea midline and thyroid not enlarged, symmetric, no tenderness/mass/nodules Resp: clear to auscultation bilaterally Cardio: regular rate and rhythm, S1, S2 normal, no murmur, click, rub or gallop GI: soft, non-tender; bowel sounds normal; no masses,  no organomegaly Neurologic: Alert and oriented X 3, normal strength and tone. Normal symmetric reflexes. Normal coordination and gait  Lab Results:  [facility] 06/21/11 0615 06/20/11 0905  WBC 5.9 5.3  HGB 9.1* 7.1*  HCT 26.9* 20.7*  PLT 147* 137*   BMET  Basename 06/21/11 0615 06/20/11 0905  NA 139 138  K 3.2* 3.6  CL 109 107  CO2 23 23  GLUCOSE 91 96  BUN 17 29*  CREATININE 0.87 0.81  CALCIUM 8.8 8.7    Studies/Results: No results found.  Medications: I have reviewed the patient's current medications.  Assessment/Plan: GI bleed.  Gi following, expect that he will likely have colonoscopy tomorrow.  Prep not yet ordered by them.  ? If they can consider clear liquid diet. H+H up appropriately.  Will replace potassium by IV today (3.2)    LOS: 1 day   TISOVEC,RICHARD W 06/21/2011, 7:30 AM

## 2011-06-21 NOTE — Progress Notes (Signed)
Painless GI bleed suspected to be diverticular. Was stable then developed more bleeding. See other note

## 2011-06-21 NOTE — Progress Notes (Signed)
  [facility] Progress Note  Subjective: [name] feels fine today. He has not had any bowel movements or further bleeding since yesterday. He has no complaints of abdominal pain or nausea. He says he had been constipated at home for 3-4 days prior to the onset of the bleeding and had noticed bright red and dark red blood in his stool for a couple of days prior to admission. He has not had any previous GI bleeding and does not think that he has ever had a colonoscopy or sigmoidoscopy. He says he has been very healthy and very lucky throughout his life.  Objective: Vital signs in last 24 hours: Temp:  [97.2 F (36.2 C)-98.3 F (36.8 C)] 98 F (36.7 C) (11/12 0530) Pulse Rate:  [70-95] 81  (11/12 0530) Resp:  [18-20] 18  (11/12 0530) BP: (103-138)/(53-83) 114/64 mmHg (11/12 0530) SpO2:  [93 %-100 %] 98 % (11/12 0530) Weight:  [64.864 kg (143 lb)] 143 lb (64.864 kg) (11/11 1448) Last BM Date: 06/20/11 General:   Alert,  Well-developed, well-nourished, thin elderly, pleasant and cooperative in NAD Head:  Normocephalic and atraumatic. Eyes:  Sclera clear, no icterus.   Conjunctiva pink. Mouth:  No deformity or lesions, dentition normal. Neck:  Supple; no masses or thyromegaly. Heart:  Regular rate and rhythm; no murmurs, clicks, rubs,  or gallops. Abdomen:  Soft, nontender and nondistended. No masses, hepatosplenomegaly or hernias noted. Normal bowel sounds, without guarding, and without rebound.   .  Extremities:  Without clubbing or edema. Neurologic:  Alert and  oriented x4;  grossly normal neurologically. Skin:  Intact without significant lesions or rashes. Cervical Nodes:  No significant cervical adenopathy.   Intake/Output from previous day: 11/11 0701 - 11/12 0700 In: 2805 [I.V.:2160; Blood:645] Out: 3125 [Urine:3125] Intake/Output this shift:    Lab Results:  [facility] 06/21/11 0615 06/20/11 0905  WBC 5.9 5.3  HGB 9.1* 7.1*  HCT 26.9* 20.7*  PLT 147* 137*    BMET  Basename 06/21/11 0615 06/20/11 0905  NA 139 138  K 3.2* 3.6  CL 109 107  CO2 23 23  GLUCOSE 91 96  BUN 17 29*  CREATININE 0.87 0.81  CALCIUM 8.8 8.7   LFT  Basename 06/21/11 0615  PROT 5.1*  ALBUMIN 2.8*  AST 20  ALT 12  ALKPHOS 50  BILITOT 1.1  BILIDIR --  IBILI --   PT/INR  Basename 06/21/11 0615 06/20/11 0905  LABPROT 16.0* 15.7*  INR 1.25 1.22   Hepatitis Panel No results found for this basename: HEPBSAG,HCVAB,HEPAIGM,HEPBIGM in the last 72 hours   Studies/Results: No results found.   Assessment / Plan: #36 77 year old male with acute lower GI bleed, resolving. Very likely this represents diverticular bleeding, however patient has not had prior colonoscopic evaluation so cannot absolutely rule out occult lesion etc. #2 anemia normocytic, secondary to acute blood loss area Stable status post transfusion with hemoglobin 9.1 today  Discussion was had with the patient regarding management of his bleeding. We discussed colonoscopy in detail and also offered the option of observation given his advanced age. Patient will think about it however at this point he is leaning towards observation only, and proceeding with colonoscopy only if he should have a recurrent bleed. Will advance to full liquid diet and continue serial H&H's for now.     LOS: 1 day   Amy Esterwood  06/21/2011, 9:44 AM

## 2011-06-21 NOTE — Progress Notes (Signed)
Blood ended @ 1800, VSS, 350cc infused, no s/s of rxn

## 2011-06-22 ENCOUNTER — Encounter (HOSPITAL_COMMUNITY): Payer: Self-pay | Admitting: *Deleted

## 2011-06-22 ENCOUNTER — Encounter (HOSPITAL_COMMUNITY)
Admission: EM | Disposition: A | Discharge status: Home Health | Payer: Self-pay | Source: Home / Self Care | Attending: Internal Medicine

## 2011-06-22 DIAGNOSIS — K5731 Diverticulosis of large intestine without perforation or abscess with bleeding: Secondary | ICD-10-CM

## 2011-06-22 DIAGNOSIS — K922 Gastrointestinal hemorrhage, unspecified: Secondary | ICD-10-CM | POA: Diagnosis present

## 2011-06-22 DIAGNOSIS — D649 Anemia, unspecified: Secondary | ICD-10-CM | POA: Diagnosis present

## 2011-06-22 DIAGNOSIS — E876 Hypokalemia: Secondary | ICD-10-CM | POA: Diagnosis present

## 2011-06-22 HISTORY — PX: COLONOSCOPY: SHX5424

## 2011-06-22 LAB — CBC
HCT: 31.3 % — ABNORMAL LOW (ref 39.0–52.0)
MCH: 29.3 pg (ref 26.0–34.0)
MCHC: 32.9 g/dL (ref 30.0–36.0)
MCV: 88.9 fL (ref 78.0–100.0)
Platelets: 155 10*3/uL (ref 150–400)
RDW: 15.7 % — ABNORMAL HIGH (ref 11.5–15.5)
WBC: 5.7 10*3/uL (ref 4.0–10.5)

## 2011-06-22 SURGERY — COLONOSCOPY
Anesthesia: Moderate Sedation

## 2011-06-22 MED ORDER — FENTANYL CITRATE 0.05 MG/ML IJ SOLN
INTRAMUSCULAR | Status: AC
  Filled 2011-06-22: qty 2

## 2011-06-22 MED ORDER — MIDAZOLAM HCL 10 MG/2ML IJ SOLN
INTRAMUSCULAR | Status: DC | PRN
  Administered 2011-06-22 (×3): 1 mg via INTRAVENOUS

## 2011-06-22 MED ORDER — FENTANYL NICU IV SYRINGE 50 MCG/ML
INJECTION | INTRAMUSCULAR | Status: DC | PRN
  Administered 2011-06-22: 25 ug via INTRAVENOUS
  Administered 2011-06-22: 12.5 ug via INTRAVENOUS

## 2011-06-22 MED ORDER — MIDAZOLAM HCL 10 MG/2ML IJ SOLN
INTRAMUSCULAR | Status: AC
  Filled 2011-06-22: qty 2

## 2011-06-22 MED ORDER — POTASSIUM CHLORIDE CRYS ER 20 MEQ PO TBCR
EXTENDED_RELEASE_TABLET | ORAL | Status: AC
  Filled 2011-06-22: qty 2

## 2011-06-22 MED ORDER — POTASSIUM CHLORIDE CRYS ER 20 MEQ PO TBCR
40.0000 meq | EXTENDED_RELEASE_TABLET | Freq: Once | ORAL | Status: AC
  Administered 2011-06-22: 40 meq via ORAL

## 2011-06-22 NOTE — Progress Notes (Signed)
Colonoscopy shows fresh blood but no active bleeding - severe left side diverticulosis. Bile in terminal ileum consistent with colonic bleed.  Plan is to support and observe. If further active bleeding then do nuclear medicine bleeding scan urgently to see if we can ID source and would have IR see patient re: therapeutic aangio if possible then.

## 2011-06-22 NOTE — Progress Notes (Signed)
Unclear about when to obtain stat Hgb. Hgb obtained 2 hours after first unit PRBCs infused. Result was 10.7. Consulted Dr. Jacky Kindle regarding need to transfuse second unit PRBCs.  Dr. Jacky Kindle stated no need to call GI service at this time and do not transfuse second unit of blood. Serial Hgb ordered q8h. Will continue to monitor pt closely.

## 2011-06-22 NOTE — Interval H&P Note (Signed)
History and Physical Interval Note:   06/22/2011   10:36 AM   [name]  has presented today for surgery, with the diagnosis of GI bleeding  The various methods of treatment have been discussed with the patient and family. After consideration of risks, benefits and other options for treatment, the patient has consented to  Procedure(s): COLONOSCOPY ESOPHAGOGASTRODUODENOSCOPY (EGD) as a surgical intervention .  The patients' history has been reviewed, patient examined, no change in status, stable for surgery.  I have reviewed the patients' chart and labs.  Questions were answered to the patient's satisfaction.     Stan Head  MD

## 2011-06-22 NOTE — Progress Notes (Signed)
  [facility] Progress Note  Subjective: [name] is stable this morning vomiting he completed the prep without difficulty last night and says that his stools were almost clear and early this morning he had 2 more grossly bloody bowel movements again. He has no complaint of abdominal pain lightheadedness dizziness etc. His he has remained hemodynamically stable. Last hemoglobin is 9.9 down from 10.3 last night after 2 units of blood Objective: Vital signs in last 24 hours: Temp:  [97 F (36.1 C)-99 F (37.2 C)] 98.5 F (36.9 C) (11/13 0800) Pulse Rate:  [77-99] 77  (11/13 0000) Resp:  [13-20] 13  (11/13 0000) BP: (112-143)/(59-77) 119/59 mmHg (11/13 0800) SpO2:  [98 %-99 %] 98 % (11/13 0000) Weight:  [63.8 kg (140 lb 10.5 oz)] 140 lb 10.5 oz (63.8 kg) (11/12 1623) Last BM Date: 06/22/11 General:   Alert,  Well-developed, well-nourished elderly, pleasant and cooperative in NAD Head:  Normocephalic and atraumatic. Eyes:  Sclera clear, no icterus.   Conjunctiva pink. Mouth:  No deformity or lesions, dentition normal. Neck:  Supple; no masses or thyromegaly. Heart:  Regular rate and rhythm tachycardic; no murmurs, clicks, rubs,  or gallops. Abdomen:  Soft, nontender and nondistended. No masses, hepatosplenomegaly or hernias noted. Normal bowel sounds, without guarding, and without rebound.     Extremities:  Without clubbing or edema. Neurologic:  Alert and  oriented x4;  grossly normal neurologically.   Psych:  Alert and cooperative. Normal mood and affect.  Intake/Output from previous day: 11/12 0701 - 11/13 0700 In: 4448.4 [I.V.:1798.4; Blood:350; IV Piggyback:300] Out: 1400 [Urine:1400] Intake/Output this shift: Total I/O In: 75 [I.V.:75] Out: 150 [Urine:150]  Lab Results:  Basename 06/22/11 0810 06/22/11 0325 06/21/11 2038 06/21/11 0615 06/20/11 0905  WBC -- 5.7 -- 5.9 5.3  HGB 9.9* 10.3* 10.7* -- --  HCT 28.5* 31.3* 31.4* -- --  PLT -- 155 -- 147* 137*     BMET  Basename 06/21/11 0615 06/20/11 0905  NA 139 138  K 3.2* 3.6  CL 109 107  CO2 23 23  GLUCOSE 91 96  BUN 17 29*  CREATININE 0.87 0.81  CALCIUM 8.8 8.7   LFT  Basename 06/21/11 0615  PROT 5.1*  ALBUMIN 2.8*  AST 20  ALT 12  ALKPHOS 50  BILITOT 1.1  BILIDIR --  IBILI --   PT/INR  Basename 06/21/11 0615 06/20/11 0905  LABPROT 16.0* 15.7*  INR 1.25 1.22        Studies/Results: No results found.   Assessment / Plan: #1  77-year-old male with acute presumed lower GI bleed likely diverticular, re- bleeding this a.m. post prep. Will proceed with colonoscopy and if indicated upper endoscopy this a.m.  #2 anemia acute secondary to acute blood loss continue serial H&H's and plan to transfuse him to keep his hemoglobin 9. Principal Problem:  *Lower gastrointestinal bleed Active Problems:  Anemia  Hypokalemia     LOS: 2 days   [name]  06/22/2011, 9:01 AM    

## 2011-06-22 NOTE — Progress Notes (Signed)
  Subjective: Now in step down. S/p Bowel prep - got up twice last night with bloody BMs. For colon today. No Pain. No other C/O  Objective: Vital signs in last 24 hours: Temp:  [97 F (36.1 C)-99 F (37.2 C)] 97.8 F (36.6 C) (11/13 0000) Pulse Rate:  [77-99] 77  (11/13 0000) Resp:  [13-20] 13  (11/13 0000) BP: (112-143)/(62-77) 128/65 mmHg (11/13 0000) SpO2:  [98 %-99 %] 98 % (11/13 0000) Weight:  [63.8 kg (140 lb 10.5 oz)] 140 lb 10.5 oz (63.8 kg) (11/12 1623) Weight change: -1.064 kg (-2 lb 5.5 oz) Last BM Date: 06/22/11  Intake/Output from previous day: 11/12 0701 - 11/13 0700 In: 4073.4 [I.V.:1423.4; Blood:350; IV Piggyback:300] Out: 1100 [Urine:1100] Intake/Output this shift: Total I/O In: 1450 [I.V.:450; Other:1000] Out: 575 [Urine:575]  PE) Gen - A& O Pulm  -  CTA Car - Reg - Monitor - NSR with mult PVCs OP - Dry Neck - No JVD Ab - Soft and thin. LEs - No LE edema.  Lab Results:  [facility] 06/22/11 0325 06/21/11 2038 06/21/11 0615  WBC 5.7 -- 5.9  HGB 10.3* 10.7* --  HCT 31.3* 31.4* --  PLT 155 -- 147*   BMET  Basename 06/21/11 0615 06/20/11 0905  NA 139 138  K 3.2* 3.6  CL 109 107  CO2 23 23  GLUCOSE 91 96  BUN 17 29*  CREATININE 0.87 0.81  CALCIUM 8.8 8.7    Studies/Results: No results found.    Assessment/Plan: Active Problems:  * No active hospital problems. *  1.  LGIB with 2 episodes yesterday of large-volume grossly bloody stools with clots.  He has remained hemodynamically stable. He is S/P transfusion of 2 units of packed RBCs.  He is in Step down with 2 PIVs for safety.  ASA on hold.  ? Diverticular?  Plan for colonoscopy +/- EGD today per GI/Dr Leone Payor. 2. Chronic anemia - made worse by LGIB - S/P 2U PRBCs and great appropriate response 7.1 - 9.1 - 10's.  3. Hypokalemia - yesterdays result 3.2 - follow. 4. Hypothyroidism - Subclinical and not in need of meds..  5. Hyperlipidemia.  6. Scoliosis.  7. Shortened left leg.  8.  Peripheral neuropathy.  9. Hearing loss.  10. Atrial fibrillation in 2008.  11. History of Pseudomonas bacteremia and sepsis in 2008.  12.Right lobe emphysema, status post VATS and chest tube.  13.History of dysphagia with prolonged PEG tube feedings in 2008.  14.GERD.  15.Admission in October 2012 for Enterococcal urinary tract infection. 16. DVT prophylaxis - Squeezers.   LOS: 2 days   [name] 06/22/2011, 6:48 AM

## 2011-06-22 NOTE — Progress Notes (Signed)
Patient seen prior to endoscopic procedures and has been examined.  Agree with Ms. Oswald Hillock note.

## 2011-06-22 NOTE — H&P (View-Only) (Signed)
  [facility] Progress Note  Subjective: Mr. Sevey is stable this morning vomiting he completed the prep without difficulty last night and says that his stools were almost clear and early this morning he had 2 more grossly bloody bowel movements again. He has no complaint of abdominal pain lightheadedness dizziness etc. His he has remained hemodynamically stable. Last hemoglobin is 9.9 down from 10.3 last night after 2 units of blood Objective: Vital signs in last 24 hours: Temp:  [97 F (36.1 C)-99 F (37.2 C)] 98.5 F (36.9 C) (11/13 0800) Pulse Rate:  [77-99] 77  (11/13 0000) Resp:  [13-20] 13  (11/13 0000) BP: (112-143)/(59-77) 119/59 mmHg (11/13 0800) SpO2:  [98 %-99 %] 98 % (11/13 0000) Weight:  [63.8 kg (140 lb 10.5 oz)] 140 lb 10.5 oz (63.8 kg) (11/12 1623) Last BM Date: 06/22/11 General:   Alert,  Well-developed, well-nourished elderly, pleasant and cooperative in NAD Head:  Normocephalic and atraumatic. Eyes:  Sclera clear, no icterus.   Conjunctiva pink. Mouth:  No deformity or lesions, dentition normal. Neck:  Supple; no masses or thyromegaly. Heart:  Regular rate and rhythm tachycardic; no murmurs, clicks, rubs,  or gallops. Abdomen:  Soft, nontender and nondistended. No masses, hepatosplenomegaly or hernias noted. Normal bowel sounds, without guarding, and without rebound.     Extremities:  Without clubbing or edema. Neurologic:  Alert and  oriented x4;  grossly normal neurologically.   Psych:  Alert and cooperative. Normal mood and affect.  Intake/Output from previous day: 11/12 0701 - 11/13 0700 In: 4448.4 [I.V.:1798.4; Blood:350; IV Piggyback:300] Out: 1400 [Urine:1400] Intake/Output this shift: Total I/O In: 75 [I.V.:75] Out: 150 [Urine:150]  Lab Results:  [facility] 06/22/11 0810 06/22/11 0325 06/21/11 2038 06/21/11 0615 06/20/11 0905  WBC -- 5.7 -- 5.9 5.3  HGB 9.9* 10.3* 10.7* -- --  HCT 28.5* 31.3* 31.4* -- --  PLT -- 155 -- 147* 137*     BMET  Basename 06/21/11 0615 06/20/11 0905  NA 139 138  K 3.2* 3.6  CL 109 107  CO2 23 23  GLUCOSE 91 96  BUN 17 29*  CREATININE 0.87 0.81  CALCIUM 8.8 8.7   LFT  Basename 06/21/11 0615  PROT 5.1*  ALBUMIN 2.8*  AST 20  ALT 12  ALKPHOS 50  BILITOT 1.1  BILIDIR --  IBILI --   PT/INR  Basename 06/21/11 0615 06/20/11 0905  LABPROT 16.0* 15.7*  INR 1.25 1.22        Studies/Results: No results found.   Assessment / Plan: #26  77 year old male with acute presumed lower GI bleed likely diverticular, re- bleeding this a.m. post prep. Will proceed with colonoscopy and if indicated upper endoscopy this a.m.  #2 anemia acute secondary to acute blood loss continue serial H&H's and plan to transfuse him to keep his hemoglobin 9. Principal Problem:  *Lower gastrointestinal bleed Active Problems:  Anemia  Hypokalemia     LOS: 2 days   [name]  06/22/2011, 9:01 AM

## 2011-06-23 ENCOUNTER — Encounter (HOSPITAL_COMMUNITY): Payer: Self-pay

## 2011-06-23 DIAGNOSIS — D62 Acute posthemorrhagic anemia: Secondary | ICD-10-CM

## 2011-06-23 LAB — HEMOGLOBIN AND HEMATOCRIT, BLOOD
HCT: 23.5 % — ABNORMAL LOW (ref 39.0–52.0)
HCT: 23.9 % — ABNORMAL LOW (ref 39.0–52.0)
Hemoglobin: 8 g/dL — ABNORMAL LOW (ref 13.0–17.0)
Hemoglobin: 8.3 g/dL — ABNORMAL LOW (ref 13.0–17.0)

## 2011-06-23 LAB — BASIC METABOLIC PANEL
Calcium: 8.2 mg/dL — ABNORMAL LOW (ref 8.4–10.5)
GFR calc Af Amer: 84 mL/min — ABNORMAL LOW (ref >90)
GFR calc non Af Amer: 73 mL/min — ABNORMAL LOW (ref >90)
Glucose, Bld: 90 mg/dL (ref 70–99)
Potassium: 3.9 mEq/L (ref 3.5–5.1)
Sodium: 137 mEq/L (ref 135–145)

## 2011-06-23 LAB — CBC
HCT: 25.1 % — ABNORMAL LOW (ref 39.0–52.0)
Hemoglobin: 8.7 g/dL — ABNORMAL LOW (ref 13.0–17.0)
RBC: 2.86 MIL/uL — ABNORMAL LOW (ref 4.22–5.81)
WBC: 6.2 10*3/uL (ref 4.0–10.5)

## 2011-06-23 MED ORDER — PANTOPRAZOLE SODIUM 40 MG PO TBEC
40.0000 mg | DELAYED_RELEASE_TABLET | Freq: Every day | ORAL | Status: DC
  Administered 2011-06-23 – 2011-06-24 (×2): 40 mg via ORAL
  Filled 2011-06-23 (×3): qty 1

## 2011-06-23 NOTE — Progress Notes (Signed)
  [facility] Progress Note  Subjective: [name], is doing very well this morning. He has no complaints and was happy to have solid food. He says he has not had any further bleeding since yesterday morning.  Hemoglobin early this morning was 8.3 and has increased to 8.7. He is to being transferred to telemetry today.  Objective: Vital signs in last 24 hours: Temp:  [97.3 F (36.3 C)-98.6 F (37 C)] 97.7 F (36.5 C) (11/14 0435) Pulse Rate:  [72-110] 72  (11/14 0800) Resp:  [13-42] 18  (11/14 0800) BP: (62-127)/(33-76) 108/62 mmHg (11/14 0800) SpO2:  [89 %-100 %] 99 % (11/14 0435) FiO2 (%):  [2 %-4 %] 2 % (11/13 1154) Last BM Date: 06/22/11 General:   Alert,  Well-developed, well-nourished, pleasant and cooperative in NAD Head:  Normocephalic and atraumatic. Eyes:  Sclera clear, no icterus.   Conjunctiva pink. Mouth:  No deformity or lesions, dentition normal. Neck:  Supple; no masses or thyromegaly. Heart:  Regular rate and rhythm; no murmurs, clicks, rubs,  or gallops. Abdomen:  Soft, nontender and nondistended. No masses, hepatosplenomegaly or hernias noted. Normal bowel sounds, without guarding, and without rebound.    Pulses:  Normal pulses noted. Extremities:  Without clubbing or edema. Neurologic:  Alert and  oriented x4;  grossly normal neurologically.  Psych:  Alert and cooperative. Normal mood and affect.  Intake/Output from previous day: 11/13 0701 - 11/14 0700 In: 2025 [P.O.:600; I.V.:1425] Out: 1450 [Urine:1450] Intake/Output this shift: Total I/O In: 240 [P.O.:240] Out: 200 [Urine:200]  Lab Results:  [facility] 06/23/11 0815 06/23/11 06/22/11 1603 06/22/11 0325 06/21/11 0615  WBC 6.2 -- -- 5.7 5.9  HGB 8.7* 8.3* 9.5* -- --  HCT 25.1* 23.9* 27.8* -- --  PLT 137* -- -- 155 147*   BMET  [facility] 06/23/11 06/21/11 0615  NA 137 139  K 3.9 3.2*  CL 109 109  CO2 22 23  GLUCOSE 90 91  BUN 14 17  CREATININE 0.88 0.87  CALCIUM 8.2* 8.8    LFT  Basename 06/21/11 0615  PROT 5.1*  ALBUMIN 2.8*  AST 20  ALT 12  ALKPHOS 50  BILITOT 1.1  BILIDIR --  IBILI --   PT/INR  Basename 06/21/11 0615  LABPROT 16.0*  INR 1.25       Studies/Results: No results found.   Assessment / Plan:  #74  77 year old male acute lower GI bleed, diverticular in origin. Is stable status post colonoscopy yesterday. No further active bleeding. Agree with transfer to telemetry . #2 Anemia secondary to acute blood loss ,stable today. Plan is to continue observation, if he has further active bleeding would proceed to bleeding scan and possibly angiography with embolization. Hopefully he will remain stable and can be discharged in another 24-48 hrs. Would leave him off aspirin and off  flurbiprofen on discharge.  LOS: 3 days   [name]  06/23/2011, 10:57 AM

## 2011-06-23 NOTE — Progress Notes (Signed)
If no bleeding 48-72 hours ok to dc

## 2011-06-23 NOTE — Progress Notes (Signed)
  Subjective: S/P Colonoscopy.   Colonoscopy showed fresh blood but no active bleeding - severe left side diverticulosis.  Bile in terminal ileum consistent with colonic bleed.  Plan is to support and observe.  If further active bleeding then do nuclear medicine bleeding scan urgently to see if we can ID source and would have IR see patient re: therapeutic angio. Hypokalemia replaced last night and had NSVT. No Pain. No other C/O Hypotensive in Endo - given bolus and been better post procedure. No active bleeding since.  Objective: Vital signs in last 24 hours: Temp:  [97.3 F (36.3 C)-98.6 F (37 C)] 97.7 F (36.5 C) (11/14 0435) Pulse Rate:  [73-110] 77  (11/14 0435) Resp:  [13-58] 18  (11/14 0435) BP: (62-132)/(33-85) 125/67 mmHg (11/14 0435) SpO2:  [89 %-100 %] 99 % (11/14 0435) FiO2 (%):  [2 %-4 %] 2 % (11/13 1154) Weight change:  Last BM Date: 06/22/11  Intake/Output from previous day: 11/13 0701 - 11/14 0700 In: 2025 [P.O.:600; I.V.:1425] Out: 1450 [Urine:1450] Intake/Output this shift:    PE) Gen - A& O Pulm  -  CTA Car - Reg - Monitor - NSR with mult PVCs OP - Dry Neck - No JVD Ab - Soft and thin. LEs - No LE edema.  Lab Results:  Basename 06/23/11 06/22/11 1603 06/22/11 0325 06/21/11 0615  WBC -- -- 5.7 5.9  HGB 8.3* 9.5* -- --  HCT 23.9* 27.8* -- --  PLT -- -- 155 147*   BMET  [facility] 06/23/11 06/21/11 0615  NA 137 139  K 3.9 3.2*  CL 109 109  CO2 22 23  GLUCOSE 90 91  BUN 14 17  CREATININE 0.88 0.87  CALCIUM 8.2* 8.8    Studies/Results: No results found.    Assessment/Plan: Principal Problem:  *Lower gastrointestinal bleed Active Problems:  Anemia  Hypokalemia  1.  LGIB S/P Colonoscopy showing fresh blood but no active bleeding - severe left side diverticulosis.  Bile in terminal ileum consistent with colonic bleed.  Plan is to support and observe.  If further active bleeding then do nuclear medicine bleeding scan urgently to  see if we can ID source and would have IR see patient re: therapeutic aangio if possible then. He has remained hemodynamically stable. He is S/P transfusion of 2 units of packed RBCs.  He is in Step down with 2 PIVs for safety.  ASA/NSAID still on hold.   He is HD stable and OK to TXFR to Tele. Advance diet and Hep Lock IV and ambulate - Hopefully home in @ 48 hrs?  2. Chronic anemia - made worse by LGIB - S/P 2U PRBCs and great appropriate response 7.1 - 9.1 - 10's. Now 8.3 and repeat at 8 am. Transfuse if less than 8.  Call [phone] for order 3. Hypokalemia - repleted. 4. Hypothyroidism - Subclinical and not in need of meds..  5. Hyperlipidemia.  6. Scoliosis.  7. Shortened left leg.  8. Peripheral neuropathy.  9. Hearing loss.  10. Atrial fibrillation in 2008.  No some PVCs and NSVT.  11. History of Pseudomonas bacteremia and sepsis in 2008/Right lobe empyema, status post VATS and chest tube/History of dysphagia with prolonged PEG tube feedings in 2008.  12.GERD.  13.Admission in October 2012 for Enterococcal urinary tract infection. 14. DVT prophylaxis - Squeezers.   LOS: 3 days   [name] M 06/23/2011, 7:27 AM

## 2011-06-24 LAB — TYPE AND SCREEN
ABO/RH(D): A POS
Unit division: 0
Unit division: 0

## 2011-06-24 LAB — HEMOGLOBIN AND HEMATOCRIT, BLOOD
HCT: 23 % — ABNORMAL LOW (ref 39.0–52.0)
HCT: 23.9 % — ABNORMAL LOW (ref 39.0–52.0)
Hemoglobin: 7.8 g/dL — ABNORMAL LOW (ref 13.0–17.0)
Hemoglobin: 8.2 g/dL — ABNORMAL LOW (ref 13.0–17.0)

## 2011-06-24 LAB — BASIC METABOLIC PANEL
CO2: 25 mEq/L (ref 19–32)
Calcium: 8.4 mg/dL (ref 8.4–10.5)
Potassium: 3.4 mEq/L — ABNORMAL LOW (ref 3.5–5.1)
Sodium: 138 mEq/L (ref 135–145)

## 2011-06-24 LAB — CBC
Hemoglobin: 8.9 g/dL — ABNORMAL LOW (ref 13.0–17.0)
MCH: 29.8 pg (ref 26.0–34.0)
MCHC: 33.5 g/dL (ref 30.0–36.0)
Platelets: 153 10*3/uL (ref 150–400)
Platelets: 161 10*3/uL (ref 150–400)
RBC: 2.89 MIL/uL — ABNORMAL LOW (ref 4.22–5.81)
RDW: 15.1 % (ref 11.5–15.5)
WBC: 5.1 10*3/uL (ref 4.0–10.5)

## 2011-06-24 LAB — PREPARE RBC (CROSSMATCH)

## 2011-06-24 MED ORDER — SODIUM CHLORIDE 0.9 % IV BOLUS (SEPSIS)
250.0000 mL | Freq: Once | INTRAVENOUS | Status: AC
  Administered 2011-06-24: 250 mL via INTRAVENOUS

## 2011-06-24 MED ORDER — ACETAMINOPHEN 325 MG PO TABS: 650.0000 mg | ORAL_TABLET | Freq: Once | ORAL | Status: DC

## 2011-06-24 MED ORDER — DILTIAZEM HCL 30 MG PO TABS
30.0000 mg | ORAL_TABLET | Freq: Four times a day (QID) | ORAL | Status: DC
  Administered 2011-06-24 – 2011-06-25 (×4): 30 mg via ORAL
  Filled 2011-06-24 (×8): qty 1

## 2011-06-24 MED ORDER — FUROSEMIDE 10 MG/ML IJ SOLN: 10.0000 mg | Freq: Once | INTRAMUSCULAR | Status: DC

## 2011-06-24 NOTE — Progress Notes (Signed)
Paged Md at request of patient regarding the need for PRBC.

## 2011-06-24 NOTE — Progress Notes (Signed)
  [facility] Progress Note  Subjective: [name] continues to feel well. He has no complaints. He plans to get up and walk today. He has not had a bowel movement since the colonoscopy. Hemoglobin early this morning was 7.8, he is to receive 2 units of packed RBCs today, and plan is for discharge home tomorrow .  Objective: Vital signs in last 24 hours: Temp:  [97.4 F (36.3 C)-98.3 F (36.8 C)] 97.5 F (36.4 C) (11/15 0543) Pulse Rate:  [65-81] 81  (11/15 0543) Resp:  [15-20] 16  (11/15 0543) BP: (98-114)/(53-64) 101/64 mmHg (11/15 0543) SpO2:  [97 %-100 %] 98 % (11/15 0543) Weight:  [60.827 kg (134 lb 1.6 oz)] 134 lb 1.6 oz (60.827 kg) (11/14 1940) Last BM Date: 06/22/11 General:   Alert,  Well-developed, elderly, thin, pleasant and cooperative in NAD Head:  Normocephalic and atraumatic. Eyes:  Sclera clear, no icterus.   Conjunctiva pink. Mouth:  No deformity or lesions, dentition normal. Neck:  Supple; no masses or thyromegaly. Heart:  Regular rate and rhythm; no murmurs, clicks, rubs,  or gallops. Abdomen:  Soft, nontender and nondistended. No masses, hepatosplenomegaly or hernias noted. Normal bowel sounds, without guarding, and without rebound.     Extremities:  Without clubbing or edema. Neurologic:  Alert and  oriented x4;  grossly normal neurologically.  Psych:  Alert and cooperative. Normal mood and affect.  Intake/Output from previous day: 11/14 0701 - 11/15 0700 In: 420 [P.O.:420] Out: 825 [Urine:825] Intake/Output this shift: Total I/O In: -  Out: 200 [Urine:200]  Lab Results:  [facility] 06/24/11 0810 06/24/11 0015 06/23/11 1602 06/23/11 0815 06/22/11 0325  WBC 5.1 -- -- 6.2 5.7  HGB 8.6* 7.8* 8.0* -- --  HCT 25.9* 23.0* 23.5* -- --  PLT 153 -- -- 137* 155   BMET  Basename 06/24/11 0810 06/23/11  NA 138 137  K 3.4* 3.9  CL 106 109  CO2 25 22  GLUCOSE 105* 90  BUN 15 14  CREATININE 0.91 0.88  CALCIUM 8.4 8.2*         Studies/Results: No results found.   Assessment / Plan:   #37  77 year old male stable status post acute lower GI bleed, diverticular. He has had no evidence of active bleeding in about 48 hours. #2 Anemia acute on chronic secondary to blood loss. Agree with transfusion of 2 units today and  [name] appears to be stable at this point and plan is for discharge home tomorrow. He has been advised to stay off of baby aspirin in the short term and to stop the floor of her prothrombin and use Tylenol as needed. We will sign off, please call for any problems as we are help happy to help this delightful patient.   LOS: 4 days   Amy Esterwood  06/24/2011, 9:25 AM

## 2011-06-24 NOTE — Progress Notes (Signed)
Physical Therapy Evaluation Patient Details Name: [name] MRN: [id number] DOB: [birth year] Today's Date: 06/24/2011 Time: 1100-1120   Eval II Problem List:  Patient Active Problem List  Diagnoses  . ATRIAL FIBRILLATION  . INGUINAL HERNIA  . OSTEOARTHRITIS, GENERALIZED  . Lower gastrointestinal bleed  . Anemia  . Hypokalemia    Past Medical History:  Past Medical History  Diagnosis Date  . Dysrhythmia      a- fib history  . Complication of anesthesia     aspirated during cardioversion  . Peripheral vascular disease     external hemroids  . Pneumonia      gives history of  . Anemia    Past Surgical History:  Past Surgical History  Procedure Date  . Hernia repair   . Abcess 2008    had  abcess removed from lung  . Hemroidectomy 1940  . Total knee arthroplasty 2012    rt knee    PT Assessment/Plan/Recommendation PT Assessment Clinical Impression Statement: Pt presents with diagnosis of lower GI bleed, decreased Hgb with need for multiple transfusions. Pt will benefit from PT in the acute care setting in order to maximize independence with gait and overall functional mobility in preperation for DC home with wife.  PT Recommendation/Assessment: Patient will need skilled PT in the acute care venue PT Problem List: Decreased strength;Decreased knowledge of use of DME;Decreased activity tolerance PT Therapy Diagnosis : Generalized weakness;Difficulty walking PT Plan PT Frequency: Min 3X/week PT Treatment/Interventions: DME instruction;Gait training;Stair training;Functional mobility training;Therapeutic exercise;Patient/family education PT Recommendation Recommendations for Other Services: OT consult Follow Up Recommendations: Home health PT Equipment Recommended: None recommended by PT. Recommended pt use RW for ambulation until strength/functional mobility level returns to baseline. PT Goals  Acute Rehab PT Goals PT Goal Formulation: With patient Time For Goal  Achievement: 7 days Pt will Transfer Sit to Stand/Stand to Sit: with modified independence;with upper extremity assist Pt will Ambulate: >150 feet;with modified independence;with least restrictive assistive device Pt will Go Up / Down Stairs: 3-5 stairs;with supervision;with rail(s)  PT Evaluation Precautions/Restrictions    Prior Functioning  Home Living Lives With: Spouse Receives Help From: Family Type of Home: House Home Layout: One level Home Access: Stairs to enter Entrance Stairs-Rails: Doctor, general practice of Steps: 4  Home Adaptive Equipment: Walker - rolling;Straight cane Prior Function Level of Independence: Requires assistive device for independence;Other (comment) (cane for ambulation) Cognition Cognition Arousal/Alertness: Awake/alert Overall Cognitive Status: Appears within functional limits for tasks assessed Orientation Level: Oriented X4 Sensation/Coordination Coordination Gross Motor Movements are Fluid and Coordinated: Yes Extremity Assessment RLE Assessment RLE Assessment: Exceptions to [facility] RLE Strength RLE Overall Strength Comments: Strength > or = 4/5 LLE Assessment LLE Assessment: Exceptions to [facility] LLE Strength LLE Overall Strength Comments: Strength > or = 4/5.  L LE leg discrepancy with L LE shorter than R LE Mobility (including Balance) Bed Mobility Bed Mobility: Yes Supine to Sit: 6: Modified independent (Device/Increase time);With rails;HOB flat Transfers Transfers: Yes Sit to Stand: 4: Min assist;From bed;From elevated surface;With upper extremity assist. Increased time. Sit to Stand Details (indicate cue type and reason): VCs safety, technique, hand placement. Attempted stand from low surface - pt unable. Assist to rise, stabilize. Stand to Sit: Other (comment);To chair/3-in-1;With armrests;With upper extremity assist (Min-guard assist) Ambulation/Gait Ambulation/Gait: Yes Ambulation/Gait Assistance: Other (comment) (Min-guard  assist) Ambulation/Gait Assistance Details (indicate cue type and reason): VCs safety, distance from RW.  No c/o dizziness during ambulation. Ambulation Distance (Feet): 250 Feet Assistive device: Rolling  walker Gait Pattern: Trunk flexed;Right foot flat;Left foot flat  Balance Balance Assessed:  (Pt slghtly unsteady but no LOB) Exercise    End of Session PT - End of Session Equipment Utilized During Treatment: Gait belt Activity Tolerance: Patient tolerated treatment well Patient left: in chair;with call bell in reach;with family/visitor present Nurse Communication: Mobility status for transfers;Mobility status for ambulation General Behavior During Session: [facility] for tasks performed Cognition: [facility] for tasks performed  Rebeca Alert [facility] 06/24/2011, 11:39 AM

## 2011-06-24 NOTE — Progress Notes (Signed)
No active bleeding. Getting blood today and home tomorrow unless re-bleeds.  Please call us back if the need for our services arises again during this hospitalization. Signing off.

## 2011-06-24 NOTE — Progress Notes (Signed)
  Subjective: S/P Colonoscopy.   Colonoscopy showed fresh blood but no active bleeding - severe left side diverticulosis.  No further active bleeding. He is now on the floor.  No Pain. No other C/O No active bleeding.   Objective: Vital signs in last 24 hours: Temp:  [97.4 F (36.3 C)-98.3 F (36.8 C)] 97.5 F (36.4 C) (11/15 0543) Pulse Rate:  [65-81] 81  (11/15 0543) Resp:  [15-20] 16  (11/15 0543) BP: (98-114)/(53-64) 101/64 mmHg (11/15 0543) SpO2:  [97 %-100 %] 98 % (11/15 0543) Weight:  [60.827 kg (134 lb 1.6 oz)] 134 lb 1.6 oz (60.827 kg) (11/14 1940) Weight change:  Last BM Date: 06/22/11  Intake/Output from previous day: 11/14 0701 - 11/15 0700 In: 420 [P.O.:420] Out: 825 [Urine:825] Intake/Output this shift: Total I/O In: 180 [P.O.:180] Out: 425 [Urine:425]  PE) Gen - A& O Pulm  -  CTA Car - Reg - OP - Dry Neck - No JVD Ab - Soft and thin. LEs - No LE edema.  Lab Results:  Basename 06/24/11 0015 06/23/11 1602 06/23/11 0815 06/22/11 0325  WBC -- -- 6.2 5.7  HGB 7.8* 8.0* -- --  HCT 23.0* 23.5* -- --  PLT -- -- 137* 155   BMET  Basename 06/23/11  NA 137  K 3.9  CL 109  CO2 22  GLUCOSE 90  BUN 14  CREATININE 0.88  CALCIUM 8.2*    Studies/Results: No results found.    Assessment/Plan: Principal Problem:  *Lower gastrointestinal bleed Active Problems:  Anemia  Hypokalemia  1.  LGIB S/P Colonoscopy showing fresh blood but no active bleeding - severe left side diverticulosis = Diverticular bleed..  Bile in terminal ileum consistent with colonic bleed.  Plan is to support and observe.  If further active bleeding then do nuclear medicine bleeding scan. He has remained hemodynamically stable. He is S/P transfusion of 2 units of packed RBCs.  Give 2 more units today and potential D/C for tomorrow.  ASA/NSAID still on hold - Will leave him off aspirin and off flurbiprofen on discharge Advance diet/ambulate     2. Chronic anemia - made  worse by LGIB - S/P 2U PRBCs. Will transfuse today 3. Hypokalemia - repleted. 4. Hypothyroidism - Subclinical and not in need of meds..  5. Hyperlipidemia.  6. Scoliosis.  7. Shortened left leg.  8. Peripheral neuropathy.  9. Hearing loss.  10. Atrial fibrillation in 2008.  No some PVCs and NSVT.  11. History of Pseudomonas bacteremia and sepsis in 2008/Right lobe empyema, status post VATS and chest tube/History of dysphagia with prolonged PEG tube feedings in 2008.  12.GERD.  13.Admission in October 2012 for Enterococcal urinary tract infection. 14. DVT prophylaxis - Squeezers. 15 - PT to see Pt today   LOS: 4 days   [name] M 06/24/2011, 6:37 AM

## 2011-06-25 LAB — CBC
Platelets: 180 10*3/uL (ref 150–400)
RBC: 2.99 MIL/uL — ABNORMAL LOW (ref 4.22–5.81)
WBC: 5.9 10*3/uL (ref 4.0–10.5)

## 2011-06-25 LAB — BASIC METABOLIC PANEL
CO2: 27 mEq/L (ref 19–32)
Chloride: 103 mEq/L (ref 96–112)
Potassium: 3.4 mEq/L — ABNORMAL LOW (ref 3.5–5.1)
Sodium: 136 mEq/L (ref 135–145)

## 2011-06-25 MED ORDER — POTASSIUM CHLORIDE 20 MEQ PO PACK: 40.0000 meq | PACK | Freq: Once | ORAL | Status: DC

## 2011-06-25 MED ORDER — POTASSIUM CHLORIDE CRYS ER 20 MEQ PO TBCR
40.0000 meq | EXTENDED_RELEASE_TABLET | Freq: Once | ORAL | Status: AC
  Administered 2011-06-25: 40 meq via ORAL
  Filled 2011-06-25: qty 2

## 2011-06-25 MED ORDER — ACETAMINOPHEN 325 MG PO TABS: 650.0000 mg | ORAL_TABLET | Freq: Four times a day (QID) | ORAL | Status: AC | PRN

## 2011-06-25 MED ORDER — POTASSIUM CHLORIDE CRYS ER 20 MEQ PO TBCR
40.0000 meq | EXTENDED_RELEASE_TABLET | Freq: Once | ORAL | Status: AC
  Filled 2011-06-25: qty 2

## 2011-06-25 MED ORDER — DILTIAZEM HCL 30 MG PO TABS: 30.0000 mg | ORAL_TABLET | Freq: Two times a day (BID) | ORAL | Status: DC

## 2011-06-25 NOTE — Discharge Summary (Addendum)
DISCHARGE SUMMARY  [name]  MR#: [id number]  DOB:1918-03-15  Date of Admission: 06/20/2011 Date of Discharge: 06/25/2011  Attending Physician:Cono Gebhard M  Patient's NWG:NFAOZ,HYQM M, MD, MD  Consults: GI - Dr Leone Payor  Discharge Diagnoses: Principal Problem:  *Lower gastrointestinal bleed Active Problems:  Anemia  Hypokalemia   Discharge Medications: Current Discharge Medication List    STOP taking these medications     flurbiprofen (ANSAID) 50 MG tablet         History of Present Illness: Admitted 06/20/11 with LGIB failing OutPt management. aspirin and nonsteroidal were held.Placed been placed on IV Protonix and IV fluids. He was written  for transfusion of 2 units of packed red blood cells per the ER as Hbg was 7.1    Arthritis- Tylenol for pain only. He is essentially pain-free at this point.  [facility] was consulted.    Hospital Course: 1. LGIB - Admitted given Transfusions.  He got worse and moved to stepdown.  He was prepped and had a  Colonoscopy 06/22/11 showing fresh blood but no active bleeding - severe left side diverticulosis = Diverticular bleed..  Bile in terminal ileum consistent with colonic bleed.  We continued supportive care and observation.  If further active bleeding recurred the plan was to do nuclear medicine bleeding scan.  He has remained hemodynamically stable since the colonoscopy and was transferred to the floor.   ASA/NSAID still on hold - Will leave him off aspirin and off flurbiprofen on discharge  We Advanced diet/ambulated and he is ready for D/C  His Hbgs have been up and down - the last several have been b/w 8-8.9.  Yesterday one was low @ 7.8 and we thought we would transfuse but his hbg came back up without it.  Will observe and follow up as outpt.   2. Chronic anemia - made worse by LGIB - S/P PRBCs.   3. Hypokalemia - repleted.   4. Hypothyroidism - Subclinical and not in need of meds..   5. Hyperlipidemia -  not in need of meds - will follow up as outpt.6. Scoliosis.   6. Scoliosis  7. Shortened left leg.   8. Peripheral neuropathy.   9. Hearing loss Wears hearing aides.   10. Atrial fibrillation in 2008. Now some PVCs and NSVT.  Had HRs into 120-140s yesterday with low BPs.  We bolused him with IVF and started PO Cardizem.  HR stable now and back in NSR.  Either PSVT/Afib in/out yesterday.  Stay on low dose PO cardizem and follow up as outpt.  11. History of Pseudomonas bacteremia and sepsis in 2008/Right lobe empyema, status post VATS and chest tube/History of dysphagia with prolonged PEG tube feedings in 2008.   12.GERD - S/P PO and IV PPI and can go home without it.  13.Admission in October 2012 for Enterococcal urinary tract infection.   14. DVT prophylaxis - Squeezers.   15 - PT Follow Up Recommendations: Home health PT - will set up.    Day of Discharge Exam BP 113/67  Pulse 76  Temp(Src) 97.3 F (36.3 C) (Oral)  Resp 18  Ht 5' 9.5" (1.765 m)  Wt 60.827 kg (134 lb 1.6 oz)  BMI 19.52 kg/m2  SpO2 97%  Physical Exam:  Gen - A& O  Pulm - CTA  Car - Reg -  OP - Dry  Neck - No JVD  Ab - Soft and thin.  LEs - No LE edema.   Discharge Labs:  [facility] 06/24/11 5784  06/23/11  NA 138 137  K 3.4* 3.9  CL 106 109  CO2 25 22  GLUCOSE 105* 90  BUN 15 14  CREATININE 0.91 0.88  CALCIUM 8.4 8.2*  MG -- --  PHOS -- --   No results found for this basename: AST:2,ALT:2,ALKPHOS:2,BILITOT:2,PROT:2,ALBUMIN:2 in the last 72 hours  Basename 06/24/11 2355 06/24/11 1558 06/24/11 1150 06/24/11 0810  WBC -- -- 6.2 5.1  NEUTROABS -- -- -- --  HGB 8.1* 8.2* -- --  HCT 23.6* 23.9* -- --  MCV -- -- 89.9 89.6  PLT -- -- 161 153   No results found for this basename: CKTOTAL:3,CKMB:3,CKMBINDEX:3,TROPONINI:3 in the last 72 hours No results found for this basename: TSH,T4TOTAL,FREET3,T3FREE,THYROIDAB in the last 72 hours No results found for this basename:  VITAMINB12:2,FOLATE:2,FERRITIN:2,TIBC:2,IRON:2,RETICCTPCT:2 in the last 72 hours  Discharge instructions: Follow up with me next week and call if GI Bleeding recurs.   Disposition: Home  Follow-up Appts: Follow-up with Dr. Timothy Lasso at [facility] in 1 week.  Call for appointment.  Condition on Discharge: stable  Tests Needing Follow-up: CBC and labs.  Signed: Gwen Pounds 06/25/2011, 6:47 AM   Hbg 9.2 Na 136. K 3.4. Cl 103. CO2 27.  Glucose 113. BUN 16. Cr 0.85. Ca 8.6.  GFR 74  Will get one dose Potassium and be Discharged.

## 2011-06-28 LAB — TYPE AND SCREEN

## 2011-07-06 ENCOUNTER — Encounter (HOSPITAL_COMMUNITY): Payer: Self-pay | Admitting: Internal Medicine

## 2011-09-28 ENCOUNTER — Inpatient Hospital Stay (HOSPITAL_COMMUNITY)
Admission: EM | Admit: 2011-09-28 | Discharge: 2011-10-01 | DRG: 378 | Disposition: A | Discharge status: Home / Self Care | Payer: Medicare Other | Attending: Internal Medicine | Admitting: Internal Medicine

## 2011-09-28 ENCOUNTER — Encounter (HOSPITAL_COMMUNITY): Payer: Self-pay | Admitting: Emergency Medicine

## 2011-09-28 ENCOUNTER — Other Ambulatory Visit: Payer: Self-pay

## 2011-09-28 DIAGNOSIS — E039 Hypothyroidism, unspecified: Secondary | ICD-10-CM | POA: Diagnosis present

## 2011-09-28 DIAGNOSIS — K219 Gastro-esophageal reflux disease without esophagitis: Secondary | ICD-10-CM | POA: Diagnosis present

## 2011-09-28 DIAGNOSIS — H9190 Unspecified hearing loss, unspecified ear: Secondary | ICD-10-CM | POA: Diagnosis present

## 2011-09-28 DIAGNOSIS — K922 Gastrointestinal hemorrhage, unspecified: Secondary | ICD-10-CM | POA: Diagnosis present

## 2011-09-28 DIAGNOSIS — K5731 Diverticulosis of large intestine without perforation or abscess with bleeding: Principal | ICD-10-CM | POA: Diagnosis present

## 2011-09-28 DIAGNOSIS — Z888 Allergy status to other drugs, medicaments and biological substances status: Secondary | ICD-10-CM

## 2011-09-28 DIAGNOSIS — L509 Urticaria, unspecified: Secondary | ICD-10-CM | POA: Diagnosis present

## 2011-09-28 DIAGNOSIS — D62 Acute posthemorrhagic anemia: Secondary | ICD-10-CM | POA: Diagnosis present

## 2011-09-28 DIAGNOSIS — M1990 Unspecified osteoarthritis, unspecified site: Secondary | ICD-10-CM | POA: Diagnosis present

## 2011-09-28 DIAGNOSIS — I4891 Unspecified atrial fibrillation: Secondary | ICD-10-CM | POA: Diagnosis present

## 2011-09-28 DIAGNOSIS — Z87891 Personal history of nicotine dependence: Secondary | ICD-10-CM

## 2011-09-28 DIAGNOSIS — I739 Peripheral vascular disease, unspecified: Secondary | ICD-10-CM | POA: Diagnosis present

## 2011-09-28 DIAGNOSIS — N400 Enlarged prostate without lower urinary tract symptoms: Secondary | ICD-10-CM | POA: Diagnosis present

## 2011-09-28 DIAGNOSIS — M159 Polyosteoarthritis, unspecified: Secondary | ICD-10-CM | POA: Insufficient documentation

## 2011-09-28 DIAGNOSIS — Z79899 Other long term (current) drug therapy: Secondary | ICD-10-CM

## 2011-09-28 DIAGNOSIS — B952 Enterococcus as the cause of diseases classified elsewhere: Secondary | ICD-10-CM | POA: Diagnosis present

## 2011-09-28 DIAGNOSIS — E785 Hyperlipidemia, unspecified: Secondary | ICD-10-CM | POA: Diagnosis present

## 2011-09-28 DIAGNOSIS — G609 Hereditary and idiopathic neuropathy, unspecified: Secondary | ICD-10-CM | POA: Diagnosis present

## 2011-09-28 DIAGNOSIS — M4120 Other idiopathic scoliosis, site unspecified: Secondary | ICD-10-CM | POA: Diagnosis present

## 2011-09-28 DIAGNOSIS — N390 Urinary tract infection, site not specified: Secondary | ICD-10-CM | POA: Diagnosis present

## 2011-09-28 DIAGNOSIS — Z96659 Presence of unspecified artificial knee joint: Secondary | ICD-10-CM

## 2011-09-28 DIAGNOSIS — D649 Anemia, unspecified: Secondary | ICD-10-CM | POA: Diagnosis present

## 2011-09-28 HISTORY — DX: Diverticulosis of intestine, part unspecified, without perforation or abscess without bleeding: K57.90

## 2011-09-28 HISTORY — DX: Gastrointestinal hemorrhage, unspecified: K92.2

## 2011-09-28 LAB — CBC
HCT: 34 % — ABNORMAL LOW (ref 39.0–52.0)
HCT: 32.1 % — ABNORMAL LOW (ref 39.0–52.0)
HCT: 31.2 % — ABNORMAL LOW (ref 39.0–52.0)
Hemoglobin: 11 g/dL — ABNORMAL LOW (ref 13.0–17.0)
Hemoglobin: 10.7 g/dL — ABNORMAL LOW (ref 13.0–17.0)
MCH: 29.6 pg (ref 26.0–34.0)
MCH: 29.8 pg (ref 26.0–34.0)
MCV: 87.9 fL (ref 78.0–100.0)
MCV: 87.5 fL (ref 78.0–100.0)
Platelets: 125 10*3/uL — ABNORMAL LOW (ref 150–400)
Platelets: 125 10*3/uL — ABNORMAL LOW (ref 150–400)
Platelets: 122 10*3/uL — ABNORMAL LOW (ref 150–400)
RBC: 3.87 MIL/uL — ABNORMAL LOW (ref 4.22–5.81)
RBC: 3.72 MIL/uL — ABNORMAL LOW (ref 4.22–5.81)
RBC: 3.67 MIL/uL — ABNORMAL LOW (ref 4.22–5.81)
RBC: 3.59 MIL/uL — ABNORMAL LOW (ref 4.22–5.81)
RDW: 15.8 % — ABNORMAL HIGH (ref 11.5–15.5)
RDW: 15.8 % — ABNORMAL HIGH (ref 11.5–15.5)
WBC: 4.4 10*3/uL (ref 4.0–10.5)
WBC: 3.8 10*3/uL — ABNORMAL LOW (ref 4.0–10.5)
WBC: 4 10*3/uL (ref 4.0–10.5)

## 2011-09-28 LAB — DIFFERENTIAL
Eosinophils Relative: 1 % (ref 0–5)
Lymphocytes Relative: 19 % (ref 12–46)
Lymphs Abs: 0.9 10*3/uL (ref 0.7–4.0)
Monocytes Absolute: 0.6 10*3/uL (ref 0.1–1.0)
Neutro Abs: 3 10*3/uL (ref 1.7–7.7)

## 2011-09-28 LAB — BASIC METABOLIC PANEL
CO2: 27 mEq/L (ref 19–32)
Calcium: 9.2 mg/dL (ref 8.4–10.5)
Chloride: 102 mEq/L (ref 96–112)
Creatinine, Ser: 0.91 mg/dL (ref 0.50–1.35)
Glucose, Bld: 160 mg/dL — ABNORMAL HIGH (ref 70–99)
Sodium: 137 mEq/L (ref 135–145)

## 2011-09-28 LAB — SAMPLE TO BLOOD BANK

## 2011-09-28 LAB — PROTIME-INR: INR: 1.26 (ref 0.00–1.49)

## 2011-09-28 LAB — PREPARE RBC (CROSSMATCH)

## 2011-09-28 MED ORDER — ACETAMINOPHEN 325 MG PO TABS: 650.0000 mg | ORAL_TABLET | Freq: Four times a day (QID) | ORAL | Status: DC | PRN

## 2011-09-28 MED ORDER — ACETAMINOPHEN 650 MG RE SUPP: 650.0000 mg | Freq: Four times a day (QID) | RECTAL | Status: DC | PRN

## 2011-09-28 MED ORDER — PANTOPRAZOLE SODIUM 40 MG PO TBEC
40.0000 mg | DELAYED_RELEASE_TABLET | Freq: Every day | ORAL | Status: DC
  Administered 2011-09-30 – 2011-10-01 (×2): 40 mg via ORAL
  Filled 2011-09-28 (×3): qty 1

## 2011-09-28 MED ORDER — SODIUM CHLORIDE 0.9 % IJ SOLN
3.0000 mL | Freq: Two times a day (BID) | INTRAMUSCULAR | Status: DC
  Administered 2011-09-29 – 2011-10-01 (×6): 3 mL via INTRAVENOUS

## 2011-09-28 MED ORDER — SODIUM CHLORIDE 0.9 % IV SOLN
INTRAVENOUS | Status: DC
  Administered 2011-09-28: 10:00:00 via INTRAVENOUS

## 2011-09-28 MED ORDER — DEXTROSE-NACL 5-0.45 % IV SOLN: INTRAVENOUS | Status: DC

## 2011-09-28 MED ORDER — FERROUS SULFATE 325 (65 FE) MG PO TABS
325.0000 mg | ORAL_TABLET | Freq: Every day | ORAL | Status: DC
  Administered 2011-09-29 – 2011-10-01 (×3): 325 mg via ORAL
  Filled 2011-09-28 (×3): qty 1

## 2011-09-28 NOTE — H&P (Signed)
Avelino S Luckow is an 78 y.o. male.   PCP:   Gwen Pounds, MD, MD   Chief Complaint:  GIB  HPI: 89 Male here for recurrent LGIB and s/p recent inpt admit for LGIB - During that admission Aspirin and nonsteroidal were held.  he was Placed  on IV Protonix and IV fluids. He was written for transfusion of 2 units of packed red blood cells per the ER as Hbg was 7.1.  [facility] was consulted.  Hospital Course: 1. LGIB - Admitted given Transfusions.  He got worse and moved to stepdown.  He was prepped and had a  Colonoscopy 06/22/11 showing fresh blood but no active bleeding - severe left side diverticulosis = Diverticular bleed..   Bile in terminal ileum consistent with colonic bleed.   We continued supportive care and observation.   If further active bleeding recurred the plan was to do nuclear medicine bleeding scan.   He remained hemodynamically stable since the colonoscopy and was transferred to the floor.    ASA/NSAID still on hold (but he does report restarting NSAID= flurbiprofen on his own in last week)  08/11/10 Hbg 12.1 and  ! Iron Bind.Cap.(TIBC)      368 ug/dL                   045-409 ! UIBC                      337 ug/dL                   811-914 ! Iron, Serum          [L]  31 ug/dL                    78-295 ! Iron Saturation      [LL] 8 %    PAF in hospital - currently NSR with PVCs seen on monitor.  BRBPR noted at 6:30 this am.  No other Sxs.  No N/V/D or CP.  Came to ED for eval and Rx.  Has had 3-4 episodes in the ED.  Last episode was 1.5 hrs ago and was more black    Recent UTI admission as well.  No current Sxs.  Drinking plenty of fluids - clear urine.  no burn.      Past Medical History:  Past Medical History  Diagnosis Date  . Dysrhythmia      a- fib history  . Complication of anesthesia     aspirated during cardioversion  . Peripheral vascular disease     external hemroids  . Pneumonia      gives history of  . Anemia   . GI bleeding   .  Diverticular disease    hypothyroidism, hyperlipidemia, scoliosis- short left leg, peripheral neuropathy, hearing loss- wears hearing aids, BPH, April-May 2008 (Afib, pseudomonas bacterimia, sepsis, right lobe emphysema-VATS, chest tube, dysphagia), bilateral inguinal hernias, right eye cataract, GERD, nocturia, mild neuropathy  05/2011 admission for Enterococcal urinary tract infection with acute illness/Pneumonia ruled out./ Fever, leukocytosis, and hypotension resolved./ Altered mental state, resolved./ Failure to thrive, generalized weakness, and fall risk/ PAF now back in sinus rhythm with PACs/PVCs/  Anemia.  Last hemoglobin 10.6 on October 4/.Gait disorder - PT.  06/2011 Admit for LGIB - Diverticular with Colonoscopy  Past Surgical History  Procedure Date  . Hernia repair   . Abcess 2008    had  abcess removed from lung  . Hemroidectomy  1940  . Total knee arthroplasty 2012    rt knee  . Colonoscopy 06/22/2011    Procedure: COLONOSCOPY;  Surgeon: Iva Boop, MD;  Location: [facility];  Service: Endoscopy;  Laterality: N/A;  possible egd -if colonoscopy negative  . Colonoascopy     2012    prostate biopsy- negative, right eye lens replacement VATS 2008 R TKR  Allergies:   Allergies  Allergen Reactions  . Ambien     hallucinations     Medications: Prior to Admission medications   Medication Sig Start Date End Date Taking? Authorizing Provider  ferrous sulfate 325 (65 FE) MG tablet Take 325 mg by mouth daily with breakfast.   Yes Historical Provider, MD     Medications Prior to Admission  Medication Dose Route Frequency Provider Last Rate Last Dose  . 0.9 %  sodium chloride infusion   Intravenous Continuous Juliet Rude. Rubin Payor, MD 125 mL/hr at 09/28/11 0947    . dextrose 5 %-0.45 % sodium chloride infusion   Intravenous STAT Nathan R. Rubin Payor, MD       No current outpatient prescriptions on file as of 09/28/2011.     Social History:  reports that he quit  smoking about 54 years ago. His smoking use included Cigarettes. He quit after 15 years of use. He has never used smokeless tobacco. He reports that he drinks about .6 ounces of alcohol per week. His drug history not on file. pt is married with four children and eight grandchildren.  his son is a Education officer, community in [place]. pt was a Education officer, community- retired in 1990. pt does not smoke, drinks beer occasionally with no illicit drug use history.   Family History: Family History  Problem Relation Age of Onset  . Heart failure Sister   . Malignant hyperthermia Brother    Father died at age 67 from old age and history of pneumonia. Mother died at age 60 from old age. grandparents have history of longevity. hemachromatosis in nephews. children are healthy- one son with bicuspid aortic valve   Review of Systems:  Review of Systems - Denies CP/SOB/N/V/D. No ENt Sxs. See H&P for details. No weakness. No Urinary Sxs. + Itching and restarted NSAID recently. +OA. All other ROS (-) and reviewed   Physical Exam:  Blood pressure 122/67, pulse 73, temperature 98.2 F (36.8 C), temperature source Oral, resp. rate 18, height 5\' 10"  (1.778 m), weight 63.504 kg (140 lb), SpO2 98.00%. Filed Vitals:   09/28/11 0851 09/28/11 1102  BP: 103/49 122/67  Pulse: 76 73  Temp: 98 F (36.7 C) 98.2 F (36.8 C)  TempSrc: Oral Oral  Resp: 16 18  Height: 5\' 10"  (1.778 m)   Weight: 63.504 kg (140 lb)   SpO2: 98% 98%   General appearance: HOH, A and O Head: Normocephalic, without obvious abnormality, atraumatic Eyes: conjunctivae/corneas clear. PERRL, EOM's intact.  Nose: Nares normal. Septum midline. Mucosa normal. No drainage or sinus tenderness. Throat: lips, mucosa, and tongue normal; teeth and gums normal Neck: no adenopathy, no carotid bruit, no JVD and thyroid not enlarged, symmetric, no tenderness/mass/nodules Resp: CTA Cardio: Reg with some irregularity GI: soft, non-tender; bowel sounds normal; no masses,  no  organomegaly. Thin Extremities: extremities normal, atraumatic, no cyanosis or edema Pulses: 2+ and symmetric Lymph nodes: no cervical lymphadenopathy Neurologic: Alert and oriented X 3, normal strength and tone. Normal symmetric reflexes.     Labs on Admission:   [facility] 09/28/11 0910  NA 137  K 3.5  CL 102  CO2 27  GLUCOSE 160*  BUN 25*  CREATININE 0.91  CALCIUM 9.2  MG --  PHOS --   No results found for this basename: AST:2,ALT:2,ALKPHOS:2,BILITOT:2,PROT:2,ALBUMIN:2 in the last 72 hours No results found for this basename: LIPASE:2,AMYLASE:2 in the last 72 hours  Basename 09/28/11 1200 09/28/11 0910  WBC 4.5 4.4  NEUTROABS -- 3.0  HGB 11.0* 11.6*  HCT 32.5* 34.0*  MCV 87.4 87.9  PLT 125* 121*   No results found for this basename: CKTOTAL:3,CKMB:3,CKMBINDEX:3,TROPONINI:3 in the last 72 hours Lab Results  Component Value Date   INR 1.26 09/28/2011   INR 1.25 06/21/2011   INR 1.22 06/20/2011     LAB RESULT POCT:  Results for orders placed during the hospital encounter of 09/28/11  CBC      Component Value Range   WBC 4.4  4.0 - 10.5 (K/uL)   RBC 3.87 (*) 4.22 - 5.81 (MIL/uL)   Hemoglobin 11.6 (*) 13.0 - 17.0 (g/dL)   HCT 96.0 (*) 45.4 - 52.0 (%)   MCV 87.9  78.0 - 100.0 (fL)   MCH 30.0  26.0 - 34.0 (pg)   MCHC 34.1  30.0 - 36.0 (g/dL)   RDW 09.8 (*) 11.9 - 15.5 (%)   Platelets 121 (*) 150 - 400 (K/uL)  DIFFERENTIAL      Component Value Range   Neutrophils Relative 67  43 - 77 (%)   Neutro Abs 3.0  1.7 - 7.7 (K/uL)   Lymphocytes Relative 19  12 - 46 (%)   Lymphs Abs 0.9  0.7 - 4.0 (K/uL)   Monocytes Relative 12  3 - 12 (%)   Monocytes Absolute 0.6  0.1 - 1.0 (K/uL)   Eosinophils Relative 1  0 - 5 (%)   Eosinophils Absolute 0.1  0.0 - 0.7 (K/uL)   Basophils Relative 0  0 - 1 (%)   Basophils Absolute 0.0  0.0 - 0.1 (K/uL)  BASIC METABOLIC PANEL      Component Value Range   Sodium 137  135 - 145 (mEq/L)   Potassium 3.5  3.5 - 5.1 (mEq/L)   Chloride  102  96 - 112 (mEq/L)   CO2 27  19 - 32 (mEq/L)   Glucose, Bld 160 (*) 70 - 99 (mg/dL)   BUN 25 (*) 6 - 23 (mg/dL)   Creatinine, Ser 1.47  0.50 - 1.35 (mg/dL)   Calcium 9.2  8.4 - 82.9 (mg/dL)   GFR calc non Af Amer 72 (*) >90 (mL/min)   GFR calc Af Amer 83 (*) >90 (mL/min)  PROTIME-INR      Component Value Range   Prothrombin Time 16.1 (*) 11.6 - 15.2 (seconds)   INR 1.26  0.00 - 1.49   APTT      Component Value Range   aPTT 48 (*) 24 - 37 (seconds)  SAMPLE TO BLOOD BANK      Component Value Range   Blood Bank Specimen SAMPLE AVAILABLE FOR TESTING     Sample Expiration 10/01/2011    CBC      Component Value Range   WBC 4.5  4.0 - 10.5 (K/uL)   RBC 3.72 (*) 4.22 - 5.81 (MIL/uL)   Hemoglobin 11.0 (*) 13.0 - 17.0 (g/dL)   HCT 56.2 (*) 13.0 - 52.0 (%)   MCV 87.4  78.0 - 100.0 (fL)   MCH 29.6  26.0 - 34.0 (pg)   MCHC 33.8  30.0 - 36.0 (g/dL)   RDW 86.5 (*) 78.4 - 15.5 (%)  Platelets 125 (*) 150 - 400 (K/uL)      Radiological Exams on Admission: No results found.    Orders placed during the hospital encounter of 06/20/11  . EKG     Assessment/Plan Principal Problem:  *Lower gastrointestinal bleed Active Problems:  OSTEOARTHRITIS, GENERALIZED  Anemia Problem # 1:  LGIB - Presumed recurrent Diverticular For Nov LGIB - He was admitted given Transfusions. He got worse and moved to stepdown. He was prepped and had a Colonoscopy 06/22/11 showing fresh blood but no active bleeding - severe left side diverticulosis = Diverticular bleed..  Bile in terminal ileum consistent with colonic bleed.  We continued supportive care and observation.  If further active bleeding recurred the plan was to do nuclear medicine bleeding scan. He recovered and Hbg improved and Hbg 12.1 on 08/12/11.  He was supposed to see me tomorrow.  He was supposed to Hold ASA/NSAID - flurbiprofen forever but restarted the NSAID.  We will stop it again. ADMIT He is HD stable and Hbg is OK at 11.0. Follow  H&Hs. Will give supportive care. Will get GI involved. Type and screen and transfuse prn Hold on Surgery/Tagged scan/Embolization unless Life or death.  2) URTICARIA (ICD-708.9) No rashes. no meds indicated. OK for OTC Hydrocortisone.  Problem #3:  ANEMIA (ICD-285.9) Hbg 8.1 on D/c. 12.1 last office eval. Remains Iron Def. Now Bleeding again. Will follow.   Problem # 4:  URINARY TRACT INFECTIONS (ICD-599.0)  S/p Enterococcal UTI/Urosepsis. Better. No Sxs.  Problem # 5:  ATRIAL FIBRILLATION, PAROXYSMAL (ICD-427.31) No further eveidence of Sxs. cannot anticoagulate. Holding on the cardizem. NSR and PVCs on monitor.  Problem # 6:  DIVERTICULOSIS OF COLON (ICD-562.10) OK for stool softeners.  #7 Hypothyroidism - Subclinical and not in need of meds..   8. Hyperlipidemia - not in need of meds - will follow up as outpt.  9. Scoliosis  10 Shortened left leg.  11. Peripheral neuropathy.  12. Hearing loss Wears hearing aides.  13. History of Pseudomonas bacteremia and sepsis in 2008/Right lobe empyema, status post VATS and chest tube/History of dysphagia with prolonged PEG tube feedings in 2008.  14.GERD -  PPI 15.Admission in October 2012 for Enterococcal urinary tract infection.  16. DVT prophylaxis - Squeezers.       Complete Medication List: 1)  Tylenol 325 Mg Tabs (Acetaminophen) .... As needed 2)  Cod Liver Oil Caps (Cod liver oil caps) .... One capsule by mouth occ 3)  Colace 100 Mg Caps (Docusate sodium) .... One po daily except if diarrhea    Betzy Barbier M 09/28/2011, 1:31 PM

## 2011-09-28 NOTE — Consult Note (Addendum)
The patient was seen and examined. Acute bleeding is most likely from a recurrent diverticular bleed. It appears to be a rather slow rate.  Recommendations #1 if bleeding rate increase significantly I would proceed with a bleeding scan. Hold interventional GI studies for now.

## 2011-09-28 NOTE — ED Notes (Signed)
GI MD at bedside

## 2011-09-28 NOTE — Consult Note (Signed)
[facility] Consultation  Referring Provider: Dr. Timothy Lasso [facility]) Primary Care Physician:  Gwen Pounds, MD, MD Primary Gastroenterologist:   Leone Payor Reason for Consultation:  Lower GI bleeding  HPI: [name] is a 78 y.o. male who was hospitalized mid November 2012 with lower GI bleed, presumed diverticular. Hemoglobin fell to high 7 range, he required 3 units of PRBC and hemoglobin before discharge was 9.2.  Colonoscopy during that admission showed blood throughout colon and severe diverticulosis in left colon. At 6am this morning patient urinated and then noticed blood tricking down his leg while walking back to bed. Since then he has had at least two bloody bowel movements. Bleeding is painless. He has no dizziness or shortness of breath. Initial hemoglobin in ED was 11.6, it is now at 11.0. Patient doesn't take any NSAIDS. His only medication is iron.     Past Medical History  Diagnosis Date  . Dysrhythmia      a- fib history  . Complication of anesthesia     aspirated during cardioversion  . Peripheral vascular disease     external hemroids  . Pneumonia      gives history of  . Anemia   . GI bleeding   . Diverticular disease     Past Surgical History  Procedure Date  . Hernia repair   . Abcess 2008    had  abcess removed from lung  . Hemroidectomy 1940  . Total knee arthroplasty 2012    rt knee  . Colonoscopy 06/22/2011    Procedure: COLONOSCOPY;  Surgeon: Iva Boop, MD;  Location: [facility];  Service: Endoscopy;  Laterality: N/A;  possible egd -if colonoscopy negative  . Colonoascopy     2012    Prior to Admission medications   Medication Sig Start Date End Date Taking? Authorizing Provider  ferrous sulfate 325 (65 FE) MG tablet Take 325 mg by mouth daily with breakfast.   Yes Historical Provider, MD    Current Facility-Administered Medications  Medication Dose Route Frequency Provider Last Rate Last Dose  . 0.9 %   sodium chloride infusion   Intravenous Continuous Gwen Pounds, MD 125 mL/hr at 09/28/11 [phone]    . dextrose 5 %-0.45 % sodium chloride infusion   Intravenous STAT Nathan R. Rubin Payor, MD       Current Outpatient Prescriptions  Medication Sig Dispense Refill  . ferrous sulfate 325 (65 FE) MG tablet Take 325 mg by mouth daily with breakfast.        Allergies as of 09/28/2011 - Review Complete 09/28/2011  Allergen Reaction Noted  . Ambien  09/28/2011    Family History  Problem Relation Age of Onset  . Heart failure Sister   . Malignant hyperthermia Brother     History   Social History  . Marital Status: Married    Spouse Name: N/A    Number of Children: N/A  . Years of Education: N/A   Occupational History  . Not on file.   Social History Main Topics  . Smoking status: Former Smoker -- 15 years    Types: Cigarettes    Quit date: 06/19/1957  . Smokeless tobacco: Never Used  . Alcohol Use: 0.6 oz/week    1 Glasses of wine per week  . Drug Use: Not on file  . Sexually Active: Not Currently    Review of Systems: All other systems reviewed and negative except where noted in HPI.   PHYSICAL EXAM: Vital signs in last  24 hours: Temp:  [98 F (36.7 C)-98.2 F (36.8 C)] 98.2 F (36.8 C) (02/19 1102) Pulse Rate:  [73-76] 73  (02/19 1102) Resp:  [16-18] 18  (02/19 1102) BP: (103-122)/(49-67) 122/67 mmHg (02/19 1102) SpO2:  [98 %] 98 % (02/19 1102) Weight:  [140 lb (63.504 kg)] 140 lb (63.504 kg) (02/19 0851)   General:   Thin white male in NAD Head:  Normocephalic and atraumatic. Eyes:   No icterus.   Conjunctiva pink. Ears:  Hard of hearing, bilateral hearing aids Mouth:  Tongue moist. Neck:  Supple; no masses felt Lungs:  Respirations even and unlabored. Lungs clear to auscultation bilaterally.   No wheezes, crackles, or rhonchi.  Heart:  Occasional irregular beat.  Abdomen:  Soft, nondistended, nontender. Normal bowel sounds. No appreciable masses or hepatomegaly.    Rectal:  Small amount of bright red blood in vault. A few perianal skin tags. .  Msk:  Symmetrical without gross deformities.  Extremities:  Without edema. Neurologic:  Alert and  oriented x4;  grossly normal neurologically. Skin:  Intact without significant lesions or rashes. Cervical Nodes:  No significant cervical adenopathy. Psych:  Alert and cooperative. Normal affect.  LAB RESULTS:  Basename 09/28/11 1200 09/28/11 0910  WBC 4.5 4.4  HGB 11.0* 11.6*  HCT 32.5* 34.0*  PLT 125* 121*   BMET  Basename 09/28/11 0910  NA 137  K 3.5  CL 102  CO2 27  GLUCOSE 160*  BUN 25*  CREATININE 0.91  CALCIUM 9.2   PT/INR  Basename 09/28/11 0910  LABPROT 16.1*  INR 1.26    PREVIOUS ENDOSCOPIES: Colonoscopy November 2011 Leone Payor) for lower GI bleeding. See HPI  IMPRESSION / PLAN: 44.  78 year old male with recurrent painless lower GI bleeding. Abdominal exam is benign. Suspect diverticular hemorrhage. Blood throughout colon on November 2012 colonoscopy but exact source of bleeding wasn't found. Doubt he is bleeding rapidly enough for RBC scanning / angio at this point. Will monitor him closely and support with blood is necessary. Intervention (repeat colonoscopy, scanning, etc...) pending clinical course.  2. History of A-fib, he is not on any medications at present.  Heart rate 73.   Thanks   LOS: 0 days   Willette Cluster  09/28/2011, 1:52 PM

## 2011-09-28 NOTE — ED Provider Notes (Signed)
History     CSN: [id number]  Arrival date & time 09/28/11  0830   First MD Initiated Contact with Patient 09/28/11 [phone]      Chief Complaint  Patient presents with  . GI Bleeding    hx of same in Nov 2012    (Consider location/radiation/quality/duration/timing/severity/associated sxs/prior treatment) The history is provided by a relative and the patient.   patient has had bright red blood in the toilet twice today. He is a history of likely diverticular bleeds. He was admitted for the same in November. He is a colonoscopy but no clear bleeding source was found. His baseline hemoglobin runs low. Required 2 units in November. He has had no other bleeding. No lightheadedness or dizziness. No Shortness of breath. Past Medical History  Diagnosis Date  . Dysrhythmia      a- fib history  . Complication of anesthesia     aspirated during cardioversion  . Peripheral vascular disease     external hemroids  . Pneumonia      gives history of  . Anemia   . GI bleeding   . Diverticular disease     Past Surgical History  Procedure Date  . Hernia repair   . Abcess 2008    had  abcess removed from lung  . Hemroidectomy 1940  . Total knee arthroplasty 2012    rt knee  . Colonoscopy 06/22/2011    Procedure: COLONOSCOPY;  Surgeon: Iva Boop, MD;  Location: [facility];  Service: Endoscopy;  Laterality: N/A;  possible egd -if colonoscopy negative  . Colonoascopy     2012    Family History  Problem Relation Age of Onset  . Heart failure Sister   . Malignant hyperthermia Brother     History  Substance Use Topics  . Smoking status: Former Smoker -- 15 years    Types: Cigarettes    Quit date: 06/19/1957  . Smokeless tobacco: Never Used  . Alcohol Use: 0.6 oz/week    1 Glasses of wine per week      Review of Systems  Constitutional: Negative for activity change and appetite change.  HENT: Negative for neck stiffness.   Eyes: Negative for pain.  Respiratory: Negative  for chest tightness and shortness of breath.   Cardiovascular: Negative for chest pain and leg swelling.  Gastrointestinal: Positive for blood in stool and anal bleeding. Negative for nausea, vomiting, abdominal pain, diarrhea and rectal pain.  Genitourinary: Negative for flank pain.  Musculoskeletal: Negative for back pain.  Skin: Negative for rash.  Neurological: Negative for weakness, numbness and headaches.  Psychiatric/Behavioral: Negative for behavioral problems.    Allergies  Ambien  Home Medications   Current Outpatient Rx  Name Route Sig Dispense Refill  . DILTIAZEM HCL 30 MG PO TABS Oral Take 1 tablet (30 mg total) by mouth 2 (two) times daily. 60 tablet 1    BP 103/49  Pulse 76  Temp(Src) 98 F (36.7 C) (Oral)  Resp 16  Ht 5\' 10"  (1.778 m)  Wt 140 lb (63.504 kg)  BMI 20.09 kg/m2  SpO2 98%  Physical Exam  Nursing note and vitals reviewed. Constitutional: He is oriented to person, place, and time. He appears well-developed and well-nourished.  HENT:  Head: Normocephalic and atraumatic.  Cardiovascular: Normal rate, regular rhythm and normal heart sounds.   No murmur heard. Pulmonary/Chest: Effort normal and breath sounds normal.  Abdominal: Soft. Bowel sounds are normal. He exhibits no distension and no mass. There is no tenderness.  There is no rebound and no guarding.  Musculoskeletal: Normal range of motion. He exhibits no edema.  Neurological: He is alert and oriented to person, place, and time. No cranial nerve deficit.  Skin: Skin is warm and dry.  Psychiatric: He has a normal mood and affect.    ED Course  Procedures (including critical care time)  Labs Reviewed  CBC - Abnormal; Notable for the following:    RBC 3.87 (*)    Hemoglobin 11.6 (*)    HCT 34.0 (*)    RDW 16.0 (*)    Platelets 121 (*)    All other components within normal limits  BASIC METABOLIC PANEL - Abnormal; Notable for the following:    Glucose, Bld 160 (*)    BUN 25 (*)     GFR calc non Af Amer 72 (*)    GFR calc Af Amer 83 (*)    All other components within normal limits  PROTIME-INR - Abnormal; Notable for the following:    Prothrombin Time 16.1 (*)    All other components within normal limits  APTT - Abnormal; Notable for the following:    aPTT 48 (*)    All other components within normal limits  DIFFERENTIAL  SAMPLE TO BLOOD BANK  SAMPLE TO BLOOD BANK  CBC   No results found.   1. Lower gastrointestinal bleed       MDM  GI bleed with history of same. Hemoglobin is about baseline. He has frank purple blood on rectal exam. His previous bleeds that were thought to be diverticular in origin. He required transfusions a few months ago. He'll be admitted to medicine with a GI consult Dr. Timothy Lasso requested a repeat hemoglobin at noon      [facility] R. Rubin Payor, MD 09/28/11 1040

## 2011-09-28 NOTE — ED Notes (Signed)
Pt had bleeding diverticuli in Nov 2012, had colonoscopy at that time. Required blood transfusion.  Arrives this am with new onset rectal bleeding x2 bright red blood. Denies dizziness, sob, pain.

## 2011-09-29 DIAGNOSIS — K922 Gastrointestinal hemorrhage, unspecified: Secondary | ICD-10-CM

## 2011-09-29 LAB — CBC
HCT: 29.4 % — ABNORMAL LOW (ref 39.0–52.0)
HCT: 28.7 % — ABNORMAL LOW (ref 39.0–52.0)
MCH: 30.1 pg (ref 26.0–34.0)
MCH: 30.3 pg (ref 26.0–34.0)
MCHC: 33.8 g/dL (ref 30.0–36.0)
MCHC: 34.5 g/dL (ref 30.0–36.0)
MCV: 87.5 fL (ref 78.0–100.0)
Platelets: 122 10*3/uL — ABNORMAL LOW (ref 150–400)
Platelets: 124 10*3/uL — ABNORMAL LOW (ref 150–400)
Platelets: 125 10*3/uL — ABNORMAL LOW (ref 150–400)
RBC: 3.36 MIL/uL — ABNORMAL LOW (ref 4.22–5.81)
RBC: 3.43 MIL/uL — ABNORMAL LOW (ref 4.22–5.81)
RDW: 15.8 % — ABNORMAL HIGH (ref 11.5–15.5)
RDW: 15.9 % — ABNORMAL HIGH (ref 11.5–15.5)
RDW: 16 % — ABNORMAL HIGH (ref 11.5–15.5)
WBC: 4.2 10*3/uL (ref 4.0–10.5)

## 2011-09-29 LAB — COMPREHENSIVE METABOLIC PANEL
AST: 21 U/L (ref 0–37)
Albumin: 3 g/dL — ABNORMAL LOW (ref 3.5–5.2)
Alkaline Phosphatase: 49 U/L (ref 39–117)
Chloride: 109 mEq/L (ref 96–112)
Creatinine, Ser: 0.74 mg/dL (ref 0.50–1.35)
Potassium: 3.7 mEq/L (ref 3.5–5.1)
Sodium: 141 mEq/L (ref 135–145)
Total Bilirubin: 0.8 mg/dL (ref 0.3–1.2)

## 2011-09-29 NOTE — Progress Notes (Signed)
Subjective: Admitted for recurrent GIB - presumed lower from Diverticular. Had 3-4 bloody episodes last night. Hbg fine No pain No CP or SOB or lightheaded. Some week.  Objective: Vital signs in last 24 hours: Temp:  [98 F (36.7 C)-98.7 F (37.1 C)] 98.7 F (37.1 C) (02/20 0549) Pulse Rate:  [71-81] 77  (02/20 0549) Resp:  [16-18] 18  (02/20 0549) BP: (103-128)/(49-75) 124/75 mmHg (02/20 0549) SpO2:  [96 %-98 %] 96 % (02/20 0549) Weight:  [63.504 kg (140 lb)-65.1 kg (143 lb 8.3 oz)] 64.8 kg (142 lb 13.7 oz) (02/20 0549) Weight change:  Last BM Date: 09/29/11  CBG (last 3)  No results found for this basename: GLUCAP:3 in the last 72 hours  Intake/Output from previous day:  Intake/Output Summary (Last 24 hours) at 09/29/11 0643 Last data filed at 09/29/11 0549  Gross per 24 hour  Intake 2314.58 ml  Output    304 ml  Net 2010.58 ml   02/19 0701 - 02/20 0700 In: 2314.6 [P.O.:600; I.V.:1714.6] Out: 304 [Urine:300; Stool:4]   Physical Exam General appearance: HOH, A and O  Head: Normocephalic, without obvious abnormality, atraumatic  Eyes: conjunctivae/corneas clear. PERRL, EOM's intact.  Nose: Nares normal. Septum midline. Mucosa normal. No drainage or sinus tenderness.  Throat: lips, mucosa, and tongue normal; teeth and gums normal  Neck: no adenopathy, no carotid bruit, no JVD and thyroid not enlarged, symmetric, no tenderness/mass/nodules  Resp: CTA  Cardio: Reg with some irregularity  GI: soft, non-tender; bowel sounds normal; no masses, no organomegaly. Thin  Extremities: extremities normal, atraumatic, no cyanosis or edema  Pulses: 2+ and symmetric  Lymph nodes: no cervical lymphadenopathy  Neurologic: Alert and oriented X 3, normal strength and tone. Normal symmetric reflexes.    Lab Results:  [facility] 09/29/11 0415 09/28/11 0910  NA 141 137  K 3.7 3.5  CL 109 102  CO2 24 27  GLUCOSE 94 160*  BUN 18 25*  CREATININE 0.74 0.91  CALCIUM 8.9 9.2  MG --  --  PHOS -- --     Basename 09/29/11 0415  AST 21  ALT 12  ALKPHOS 49  BILITOT 0.8  PROT 5.2*  ALBUMIN 3.0*     Basename 09/29/11 0415 09/28/11 2328 09/28/11 0910  WBC 5.0 4.8 --  NEUTROABS -- -- 3.0  HGB 10.4* 10.7* --  HCT 30.0* 31.2* --  MCV 87.5 86.9 --  PLT 125* 124* --    Lab Results  Component Value Date   INR 1.26 09/28/2011   INR 1.25 06/21/2011   INR 1.22 06/20/2011    No results found for this basename: CKTOTAL:3,CKMB:3,CKMBINDEX:3,TROPONINI:3 in the last 72 hours  No results found for this basename: TSH,T4TOTAL,FREET3,T3FREE,THYROIDAB in the last 72 hours  No results found for this basename: VITAMINB12:2,FOLATE:2,FERRITIN:2,TIBC:2,IRON:2,RETICCTPCT:2 in the last 72 hours  Micro Results: No results found for this or any previous visit (from the past 240 hour(s)).   Studies/Results: No results found.   Medications: Scheduled:   . ferrous sulfate  325 mg Oral QPC breakfast  . pantoprazole  40 mg Oral Q0600  . sodium chloride  3 mL Intravenous Q12H  . DISCONTD: dextrose 5 % and 0.45% NaCl   Intravenous STAT   Continuous:   . sodium chloride 50 mL/hr at 09/28/11 2330     Assessment/Plan: Principal Problem:  *Lower gastrointestinal bleed Active Problems:  OSTEOARTHRITIS, GENERALIZED  Anemia  Problem # 1: LGIB - Presumed recurrent Diverticular  Remains HD stable. Hbg trended down to 10.4 Will continue  supportive care and observation.  If further active bleeding recurs the plan may be to do nuclear medicine bleeding scan.  Flurbiprofen again D/Ced  Follow H&Hs.  Appreciate GI involvement.  Type and screen done and transfuse prn  Hold on Surgery/Tagged scan/Embolization unless Life or death.   2) URTICARIA (ICD-708.9)  No rashes.  no meds indicated.  OK for OTC Hydrocortisone.   Problem #3: ANEMIA (ICD-285.9)  Hbg 8.1 on D/c. 12.1 last office eval.  Remains Iron Def.  Now Bleeding again.  Will follow.  Hbg 11.7 - 11 -10.7 -  10.4.  Problem # 4: URINARY TRACT INFECTIONS (ICD-599.0)  S/p Enterococcal UTI/Urosepsis.  Better.  No Sxs.   Problem # 5: ATRIAL FIBRILLATION, PAROXYSMAL (ICD-427.31)  No further eveidence of Sxs.  cannot anticoagulate.  Holding on the cardizem as pulse is great..  NSR and PVCs on monitor.   6 HOH/Hearing loss Wears hearing aides.  7.GERD - PPI  8. DVT prophylaxis - Squeezers.      LOS: 1 day   [name] M 09/29/2011, 6:43 AM

## 2011-09-29 NOTE — Progress Notes (Signed)
09/29/11 Patient is having frank bleeding from rectum this morning during bowel movement. Doctor not notified as this is not a new occurrence. Note left in progress section for doctor.

## 2011-09-29 NOTE — Progress Notes (Signed)
Subjective: **He had several bowel movements overnight although he is not aware of the color. He remains without pain.*  Objective: Vital signs in last 24 hours: Temp:  [98 F (36.7 C)-98.7 F (37.1 C)] 98.7 F (37.1 C) (02/20 0549) Pulse Rate:  [71-81] 77  (02/20 0549) Resp:  [18] 18  (02/20 0549) BP: (122-128)/(66-75) 124/75 mmHg (02/20 0549) SpO2:  [96 %-98 %] 96 % (02/20 0549) Weight:  [142 lb 13.7 oz (64.8 kg)-143 lb 8.3 oz (65.1 kg)] 142 lb 13.7 oz (64.8 kg) (02/20 0549) Last BM Date: 09/29/11 General:   Alert,  Well-developed, well-nourished, pleasant and cooperative in NAD Head:  Normocephalic and atraumatic. Eyes:  Sclera clear, no icterus.   Conjunctiva pink. Mouth:  No deformity or lesions, dentition normal. Neck:  Supple; no masses or thyromegaly. Heart:  Regular rate and rhythm; no murmurs, clicks, rubs,  or gallops. Abdomen:  Soft, nontender and nondistended. No masses, hepatosplenomegaly or hernias noted. Normal bowel sounds, without guarding, and without rebound.   Msk:  Symmetrical without gross deformities. Normal posture. Pulses:  Normal pulses noted. Extremities:  Without clubbing or edema. Neurologic:  Alert and  oriented x4;  grossly normal neurologically. Skin:  Intact without significant lesions or rashes. Cervical Nodes:  No significant cervical adenopathy. Psych:  Alert and cooperative. Normal mood and affect.  Intake/Output from previous day: 02/19 0701 - 02/20 0700 In: 2314.6 [P.O.:600; I.V.:1714.6] Out: 304 [Urine:300; Stool:4] Intake/Output this shift:    Lab Results:  Basename 09/29/11 0415 09/28/11 2328 09/28/11 1615  WBC 5.0 4.8 4.0  HGB 10.4* 10.7* 11.0*  HCT 30.0* 31.2* 32.7*  PLT 125* 124* 122*   BMET  Basename 09/29/11 0415 09/28/11 0910  NA 141 137  K 3.7 3.5  CL 109 102  CO2 24 27  GLUCOSE 94 160*  BUN 18 25*  CREATININE 0.74 0.91  CALCIUM 8.9 9.2   LFT  Basename 09/29/11 0415  PROT 5.2*  ALBUMIN 3.0*  AST 21  ALT  12  ALKPHOS 49  BILITOT 0.8  BILIDIR --  IBILI --   PT/INR  Basename 09/28/11 0910  LABPROT 16.1*  INR 1.26   Hepatitis Panel No results found for this basename: HEPBSAG,HCVAB,HEPAIGM,HEPBIGM in the last 72 hours   Studies/Results: No results found.  Assessment: **Acute lower GI bleed-likely secondary to a diverticulum. Bleeding appears to be subsiding.  Recommendations #1 follow CBC #2 continue clears #3 no intervention at this pointMolly Maduro D. Arlyce Dice, MD, [facility] [phone]   [name]  09/29/2011, 10:42 AM

## 2011-09-30 LAB — BASIC METABOLIC PANEL
Chloride: 108 mEq/L (ref 96–112)
GFR calc Af Amer: 89 mL/min — ABNORMAL LOW (ref >90)
Potassium: 3.3 mEq/L — ABNORMAL LOW (ref 3.5–5.1)
Sodium: 140 mEq/L (ref 135–145)

## 2011-09-30 LAB — CBC
HCT: 28.4 % — ABNORMAL LOW (ref 39.0–52.0)
Hemoglobin: 9.7 g/dL — ABNORMAL LOW (ref 13.0–17.0)
Hemoglobin: 9.8 g/dL — ABNORMAL LOW (ref 13.0–17.0)
MCH: 29.3 pg (ref 26.0–34.0)
MCV: 88 fL (ref 78.0–100.0)
RBC: 3.25 MIL/uL — ABNORMAL LOW (ref 4.22–5.81)
RBC: 3.34 MIL/uL — ABNORMAL LOW (ref 4.22–5.81)
WBC: 4.9 10*3/uL (ref 4.0–10.5)
WBC: 5.5 10*3/uL (ref 4.0–10.5)

## 2011-09-30 MED ORDER — POTASSIUM CHLORIDE CRYS ER 20 MEQ PO TBCR
20.0000 meq | EXTENDED_RELEASE_TABLET | Freq: Once | ORAL | Status: AC
  Administered 2011-09-30: 20 meq via ORAL
  Filled 2011-09-30: qty 1

## 2011-09-30 NOTE — Progress Notes (Signed)
Subjective: Admitted for recurrent GIB - presumed lower from Diverticular. No more bloody episodes last night. Hbg fine No pain No CP or SOB or lightheaded. Some weekness.  Objective: Vital signs in last 24 hours: Temp:  [97.7 F (36.5 C)-98.4 F (36.9 C)] 98.4 F (36.9 C) (02/21 0524) Pulse Rate:  [68-95] 68  (02/21 0524) Resp:  [18] 18  (02/21 0524) BP: (109-116)/(67-72) 113/72 mmHg (02/21 0524) SpO2:  [79 %-97 %] 96 % (02/21 0524) Weight:  [64.7 kg (142 lb 10.2 oz)] 64.7 kg (142 lb 10.2 oz) (02/21 0524) Weight change: 1.196 kg (2 lb 10.2 oz) Last BM Date: 09/29/11  CBG (last 3)  No results found for this basename: GLUCAP:3 in the last 72 hours  Intake/Output from previous day:  Intake/Output Summary (Last 24 hours) at 09/30/11 0759 Last data filed at 09/30/11 0525  Gross per 24 hour  Intake   1543 ml  Output   1550 ml  Net     -7 ml   02/20 0701 - 02/21 0700 In: 1543 [P.O.:940; I.V.:603] Out: 1550 [Urine:1550]   Physical Exam General appearance: HOH, A and O  Head: Normocephalic, without obvious abnormality, atraumatic  Eyes: conjunctivae/corneas clear. PERRL, EOM's intact.  Nose: Nares normal. Septum midline. Mucosa normal. No drainage or sinus tenderness.  Throat: lips, mucosa, and tongue normal; teeth and gums normal  Neck: no adenopathy, no carotid bruit, no JVD and thyroid not enlarged, symmetric, no tenderness/mass/nodules  Resp: CTA  Cardio: Reg with some irregularity  GI: soft, non-tender; bowel sounds normal; no masses, no organomegaly. Thin  Extremities: extremities normal, atraumatic, no cyanosis or edema  Pulses: 2+ and symmetric  Lymph nodes: no cervical lymphadenopathy  Neurologic: Alert and oriented X 3, normal strength and tone. Normal symmetric reflexes.    Lab Results:  [facility] 09/30/11 0410 09/29/11 0415  NA 140 141  K 3.3* 3.7  CL 108 109  CO2 24 24  GLUCOSE 103* 94  BUN 13 18  CREATININE 0.78 0.74  CALCIUM 8.6 8.9  MG -- --    PHOS -- --     Basename 09/29/11 0415  AST 21  ALT 12  ALKPHOS 49  BILITOT 0.8  PROT 5.2*  ALBUMIN 3.0*     Basename 09/30/11 0410 09/29/11 2338 09/28/11 0910  WBC 4.9 5.3 --  NEUTROABS -- -- 3.0  HGB 9.7* 10.1* --  HCT 28.4* 29.3* --  MCV 87.4 87.2 --  PLT 128* 122* --    Lab Results  Component Value Date   INR 1.26 09/28/2011   INR 1.25 06/21/2011   INR 1.22 06/20/2011    No results found for this basename: CKTOTAL:3,CKMB:3,CKMBINDEX:3,TROPONINI:3 in the last 72 hours  No results found for this basename: TSH,T4TOTAL,FREET3,T3FREE,THYROIDAB in the last 72 hours  No results found for this basename: VITAMINB12:2,FOLATE:2,FERRITIN:2,TIBC:2,IRON:2,RETICCTPCT:2 in the last 72 hours  Micro Results: No results found for this or any previous visit (from the past 240 hour(s)).   Studies/Results: No results found.   Medications: Scheduled:    . ferrous sulfate  325 mg Oral QPC breakfast  . pantoprazole  40 mg Oral Q0600  . sodium chloride  3 mL Intravenous Q12H   Continuous:    . sodium chloride 50 mL/hr at 09/28/11 2330     Assessment/Plan: Principal Problem:  *Lower gastrointestinal bleed Active Problems:  OSTEOARTHRITIS, GENERALIZED  Anemia  Problem # 1: LGIB - Presumed recurrent Diverticular  Remains HD stable. Hbg trended down to 10.4 - 9.7 Will continue supportive care  and observation.  If further active bleeding recurs the plan may be to do nuclear medicine bleeding scan.  Flurbiprofen again D/Ced  Follow H&Hs.  Appreciate GI involvement.  Type and screen done and transfuse prn  Hold on Surgery/Tagged scan/Embolization unless Life or death. He is doing better.  No more bloody BMs. Advance diet, Hep Lock IV, Get moving.  If Hbg stable and he is functional, we may D/C tomorrow or Sat.   2) URTICARIA (ICD-708.9)  No rashes.  no meds indicated.  OK for OTC Hydrocortisone.   Problem #3: ANEMIA (ICD-285.9)  12.1 last office eval on 08/12/11.   Remains Iron Def.  Bleeding resolving Will follow.  Hbg 11.7 - 11 -10.7 - 10.4 - 9.7  Problem # 4: URINARY TRACT INFECTIONS (ICD-599.0)  S/p Enterococcal UTI/Urosepsis.  Better.  No Sxs.   Problem # 5: ATRIAL FIBRILLATION, PAROXYSMAL (ICD-427.31)  No further eveidence of Sxs.  cannot anticoagulate.  Holding on the cardizem as pulse is great..  NSR and PVCs on monitor.   6 HOH/Hearing loss Wears hearing aides.  7.GERD - PPI  8. DVT prophylaxis - Squeezers.      LOS: 2 days   [name] M 09/30/2011, 7:59 AM

## 2011-09-30 NOTE — Progress Notes (Signed)
Reviewed and agree with management. Atara Paterson D. Boluwatife Mutchler, M.D., FACG  

## 2011-09-30 NOTE — Progress Notes (Signed)
[facility] Progress Note  SUBJECTIVE: feels good, eating breakfast. No further GI bleeding  OBJECTIVE:  Vital signs in last 24 hours: Temp:  [97.7 F (36.5 C)-98.4 F (36.9 C)] 98.4 F (36.9 C) (02/21 0524) Pulse Rate:  [68-95] 68  (02/21 0524) Resp:  [18] 18  (02/21 0524) BP: (109-116)/(67-72) 113/72 mmHg (02/21 0524) SpO2:  [79 %-97 %] 96 % (02/21 0524) Weight:  [142 lb 10.2 oz (64.7 kg)] 142 lb 10.2 oz (64.7 kg) (02/21 0524) Last BM Date: 09/29/11 General:    Pleasant white male in NAD Abdomen:  Soft, nontender and nondistended. Normal bowel sounds. Neurologic:  Alert and oriented,  grossly normal neurologically.  Basename 09/30/11 0410 09/29/11 2338 09/29/11 1500  WBC 4.9 5.3 3.9*  HGB 9.7* 10.1* 9.7*  HCT 28.4* 29.3* 28.7*  PLT 128* 122* 125*   BMET  Basename 09/30/11 0410 09/29/11 0415 09/28/11 0910  NA 140 141 137  K 3.3* 3.7 3.5  CL 108 109 102  CO2 24 24 27   GLUCOSE 103* 94 160*  BUN 13 18 25*  CREATININE 0.78 0.74 0.91  CALCIUM 8.6 8.9 9.2   LFT  Basename 09/29/11 0415  PROT 5.2*  ALBUMIN 3.0*  AST 21  ALT 12  ALKPHOS 49  BILITOT 0.8  BILIDIR --  IBILI --   PT/INR  Basename 09/28/11 0910  LABPROT 16.1*  INR 1.26    ASSESSMENT / PLAN:  69. 78 year old with lower GI bleed, likely diverticular hemorrhage. Hemoglobin is stable at 9.7, bleeding has resolved. Hopefully home soon.    LOS: 2 days   Willette Cluster  09/30/2011, 9:04 AM

## 2011-10-01 LAB — BASIC METABOLIC PANEL
CO2: 23 mEq/L (ref 19–32)
Calcium: 8.8 mg/dL (ref 8.4–10.5)
Chloride: 108 mEq/L (ref 96–112)
Creatinine, Ser: 1 mg/dL (ref 0.50–1.35)
Glucose, Bld: 106 mg/dL — ABNORMAL HIGH (ref 70–99)
Sodium: 140 mEq/L (ref 135–145)

## 2011-10-01 LAB — CBC
HCT: 28.4 % — ABNORMAL LOW (ref 39.0–52.0)
MCH: 30 pg (ref 26.0–34.0)
MCV: 86.9 fL (ref 78.0–100.0)
Platelets: 144 10*3/uL — ABNORMAL LOW (ref 150–400)
RBC: 3.27 MIL/uL — ABNORMAL LOW (ref 4.22–5.81)

## 2011-10-01 MED ORDER — ACETAMINOPHEN 325 MG PO TABS: 650.0000 mg | ORAL_TABLET | Freq: Four times a day (QID) | ORAL | Status: DC | PRN

## 2011-10-01 NOTE — Discharge Planning (Signed)
Physician Discharge Summary  DISCHARGE SUMMARY   Patient ID: [name] MR#: [id number] DOB/AGE: 76-Apr-1919 76 y.o.   Attending Physician:Aaliya Maultsby M  Patient's QQV:ZDGLO,VFIE M, MD, MD  Consults:Treatment Team:  Louis Meckel, MD**  Admit date: 09/28/2011 Discharge date: 10/01/2011  Discharge Diagnoses:  Principal Problem:  *Lower gastrointestinal bleed Active Problems:  OSTEOARTHRITIS, GENERALIZED  Anemia   Patient Active Problem List  Diagnoses  . ATRIAL FIBRILLATION  . INGUINAL HERNIA  . OSTEOARTHRITIS, GENERALIZED  . Lower gastrointestinal bleed  . Anemia  . Hypokalemia   Past Medical History  Diagnosis Date  . Dysrhythmia      a- fib history  . Complication of anesthesia     aspirated during cardioversion  . Peripheral vascular disease     external hemroids  . Pneumonia      gives history of  . Anemia   . GI bleeding   . Diverticular disease     Discharged Condition: stable   Discharge Medications: Medication List  As of 10/01/2011  7:05 AM   TAKE these medications         acetaminophen 325 MG tablet   Commonly known as: TYLENOL   Take 2 tablets (650 mg total) by mouth every 6 (six) hours as needed (or Fever >/= 101).      ferrous sulfate 325 (65 FE) MG tablet   Take 325 mg by mouth daily with breakfast.            Hospital Procedures: No results found.  History of Present Illness: 78 Male Presented 09/28/11 for recurrent LGIB and s/p recent inpt admit for LGIB - During that admission Aspirin and nonsteroidal were held. he was Placed on IV Protonix and IV fluids. He received transfusions of  packed red blood cells. Colonoscopy 06/22/11 showing fresh blood but no active bleeding - severe left side diverticulosis = Diverticular bleed..  Bile in terminal ileum consistent with colonic bleed. He was treated withsupportive care and observation.  ASA/NSAID have been on hold (but he does report restarting NSAID= flurbiprofen on his own in  the week of admission)  08/11/10 Hbg 12.1 and  ! Iron Bind.Cap.(TIBC) 368 ug/dL 332-951  ! UIBC 337 ug/dL 884-166  ! Iron, Serum [L] 31 ug/dL 06-301  ! Iron Saturation [LL] 8 %  PAF last hospital - NSR with PVCs seen on monitor.  BRBPR noted at 6:30 on morning of admission. No other Sxs. No N/V/D or CP. Came to ED for eval and Rx. Has had 3-4 episodes in the ED.  Recent UTI admission as well. No current Sxs. Drinking plenty of fluids - clear urine. no burn.    Hospital Course: Admitted for recurrent GIB - presumed lower from Diverticular.  He was placed on Clear liquids and admitted to tele.  [facility] - Dr Arlyce Dice was consulted. He was Typed and screened. He remained HD stable. He was observed, supported clinically, and followed with serial blood work. He never needed any transfusions.  On 10/01/11 he had no more bloody episodes, Hbg fine and stable, No pain, No CP or SOB or lightheaded.  Some weakness but getting closer to baseline.  Eating well as diet advanced. Ready for D/C home.  Problem # 1: LGIB - Presumed recurrent Diverticular  Remains HD stable.  Hbg trended down to 11.6 -11 - 10.7 - 11 - 10.7 - 10.4 - 9.8 - 9.7 - 10.1 - 9.7 - 9.8 - 9.8  Flurbiprofen again D/Ced  Appreciate GI involvement.  Type  and screen done and did not need transfusion  Hold on Surgery/Tagged scan/Embolization unless Life or death.  He is doing better. No more bloody BMs.  Diet has been Advanced, Hep Lock IV yesterday, moving better. Hbg is stable and he is functional - OK for D/c Does not need Protonix on D/C Report/Call if bleeding restarts   2) URTICARIA (ICD-708.9)  No rashes.  no meds indicated.  OK for OTC Hydrocortisone.   Problem #3: ANEMIA (ICD-285.9)  12.1 last office eval on 08/12/11.  Remains Iron Def.  Bleeding resolving  Will follow.  Stay on Iron and follow up Hbg   Problem # 4: URINARY TRACT INFECTIONS (ICD-599.0)  S/p Enterococcal UTI/Urosepsis.  Better.  No Sxs.   Problem #  5: ATRIAL FIBRILLATION, PAROXYSMAL (ICD-427.31)  No further eveidence of Sxs.  cannot anticoagulate.  Holding on the cardizem as pulse is great..  NSR and PVCs on monitor.   6 HOH/Hearing loss Wears hearing aides.   7.GERD - Off PPI   8. DVT prophylaxis - Squeezers.   9. K is better       Day of Discharge Exam BP 120/75  Pulse 76  Temp(Src) 98.7 F (37.1 C) (Oral)  Resp 18  Ht 5\' 10"  (1.778 m)  Wt 64.1 kg (141 lb 5 oz)  BMI 20.28 kg/m2  SpO2 98%  Physical Exam: See PN  Discharge Labs:  [facility] 10/01/11 0505 09/30/11 0410  NA 140 140  K 3.5 3.3*  CL 108 108  CO2 23 24  GLUCOSE 106* 103*  BUN 22 13  CREATININE 1.00 0.78  CALCIUM 8.8 8.6  MG -- --  PHOS -- --    Basename 09/29/11 0415  AST 21  ALT 12  ALKPHOS 49  BILITOT 0.8  PROT 5.2*  ALBUMIN 3.0*    Basename 10/01/11 0505 09/30/11 1515 09/28/11 0910  WBC 6.1 5.5 --  NEUTROABS -- -- 3.0  HGB 9.8* 9.8* --  HCT 28.4* 29.4* --  MCV 86.9 88.0 --  PLT 144* 134* --   No results found for this basename: CKTOTAL:3,CKMB:3,CKMBINDEX:3,TROPONINI:3 in the last 72 hours No results found for this basename: TSH,T4TOTAL,FREET3,T3FREE,THYROIDAB in the last 72 hours No results found for this basename: VITAMINB12:2,FOLATE:2,FERRITIN:2,TIBC:2,IRON:2,RETICCTPCT:2 in the last 72 hours Lab Results  Component Value Date   INR 1.26 09/28/2011   INR 1.25 06/21/2011   INR 1.22 06/20/2011       Discharge instructions:  Home-Health Care Svc Follow-up Information    Follow up with Gwen Pounds, MD in 2 weeks.          Disposition: Home  Follow-up Appts: Follow-up with Dr. Timothy Lasso at [facility] in 2 weeks.  Call for appointment.  Condition on Discharge: stable  Tests Needing Follow-up: CBC  Time spent in discharge (includes decision making & examination of pt): 29 min  Signed: Dnasia Gauna M 10/01/2011, 7:05 AM

## 2011-10-01 NOTE — Discharge Instructions (Signed)
Gastrointestinal Bleeding Gastrointestinal (GI) bleeding is bleeding from the gut or any place between your mouth and anus. If bleeding is slow, you may be allowed to go home. If there is a lot of bleeding, hospitalization and observation are often required. SYMPTOMS   You vomit bright red blood or material that looks like coffee grounds.   You have blood in your stools or the stools look black and tarry.  DIAGNOSIS  Your caregiver may diagnose your condition by taking a history and a physical exam. More tests may be needed, including:  X-rays.   EGD (esophagogastroduodenoscopy), which looks at your esophagus, stomach, and small bowel through a flexible telescope-like instrument.   Colonoscopy, which looks at your colon/large bowel through a flexible telescope-like instrument.   Biopsies, which remove a small sample of tissue to examine under a microscope.  Finding out the results of your test Not all test results are available during your visit. If your test results are not back during the visit, make an appointment with your caregiver to find out the results. Do not assume everything is normal if you have not heard from your caregiver or the medical facility. It is important for you to follow up on all of your test results. HOME CARE INSTRUCTIONS   Follow instructions as suggested by your caregiver regarding medicines. Do not take aspirin, drink alcohol, or take medicines for pain and arthritis unless your caregiver says it is okay.   Get the suggested follow-up care when the tests are done.  SEEK IMMEDIATE MEDICAL CARE IF:   Your bleeding increases or you become lightheaded, weak, or pass out (faint).   You experience severe cramps in your stomach, back, or belly (abdomen).   You pass large clots.   The problems which brought you in for medical care get worse.  MAKE SURE YOU:   Understand these instructions.   Will watch your condition.   Will get help right away if you are  not doing well or get worse.  Document Released: 07/23/2000 Document Revised: 04/07/2011 Document Reviewed: 03/14/2008 [employer] Patient Information 2012 [employer]. 

## 2011-10-01 NOTE — Progress Notes (Signed)
Subjective: Admitted for recurrent GIB - presumed lower from Diverticular. No more bloody episodes Hbg fine and stable No pain No CP or SOB or lightheaded. Some weekness. Eating well. Strength fine.  Objective: Vital signs in last 24 hours: Temp:  [97.2 F (36.2 C)-98.7 F (37.1 C)] 98.7 F (37.1 C) (02/22 0525) Pulse Rate:  [76-94] 76  (02/22 0525) Resp:  [18] 18  (02/22 0525) BP: (94-120)/(54-75) 120/75 mmHg (02/22 0525) SpO2:  [97 %-98 %] 98 % (02/22 0525) Weight:  [64.1 kg (141 lb 5 oz)] 64.1 kg (141 lb 5 oz) (02/22 0500) Weight change: -0.6 kg (-1 lb 5.2 oz) Last BM Date: 09/29/11  CBG (last 3)  No results found for this basename: GLUCAP:3 in the last 72 hours  Intake/Output from previous day:  Intake/Output Summary (Last 24 hours) at 10/01/11 0655 Last data filed at 10/01/11 9811  Gross per 24 hour  Intake    413 ml  Output   1675 ml  Net  -1262 ml   02/21 0701 - 02/22 0700 In: 413 [P.O.:360; I.V.:53] Out: 1675 [Urine:1675]   Physical Exam General appearance: HOH, A and O  Head: Normocephalic, without obvious abnormality, atraumatic  Eyes: conjunctivae/corneas clear. PERRL, EOM's intact.  Nose: Nares normal. Septum midline. Mucosa normal. No drainage or sinus tenderness.  Throat: lips, mucosa, and tongue normal; teeth and gums normal  Neck: no adenopathy, no carotid bruit, no JVD and thyroid not enlarged, symmetric, no tenderness/mass/nodules  Resp: CTA  Cardio: Reg with some irregularity  GI: soft, non-tender; bowel sounds normal; no masses, no organomegaly. Thin  Extremities: extremities normal, atraumatic, no cyanosis or edema  Pulses: 2+ and symmetric  Lymph nodes: no cervical lymphadenopathy  Neurologic: Alert and oriented X 3, normal strength and tone. Normal symmetric reflexes.    Lab Results:  [facility] 10/01/11 0505 09/30/11 0410  NA 140 140  K 3.5 3.3*  CL 108 108  CO2 23 24  GLUCOSE 106* 103*  BUN 22 13  CREATININE 1.00 0.78  CALCIUM  8.8 8.6  MG -- --  PHOS -- --     Basename 09/29/11 0415  AST 21  ALT 12  ALKPHOS 49  BILITOT 0.8  PROT 5.2*  ALBUMIN 3.0*     Basename 10/01/11 0505 09/30/11 1515 09/28/11 0910  WBC 6.1 5.5 --  NEUTROABS -- -- 3.0  HGB 9.8* 9.8* --  HCT 28.4* 29.4* --  MCV 86.9 88.0 --  PLT 144* 134* --    Lab Results  Component Value Date   INR 1.26 09/28/2011   INR 1.25 06/21/2011   INR 1.22 06/20/2011    No results found for this basename: CKTOTAL:3,CKMB:3,CKMBINDEX:3,TROPONINI:3 in the last 72 hours  No results found for this basename: TSH,T4TOTAL,FREET3,T3FREE,THYROIDAB in the last 72 hours  No results found for this basename: VITAMINB12:2,FOLATE:2,FERRITIN:2,TIBC:2,IRON:2,RETICCTPCT:2 in the last 72 hours  Micro Results: No results found for this or any previous visit (from the past 240 hour(s)).   Studies/Results: No results found.   Medications: Scheduled:    . ferrous sulfate  325 mg Oral QPC breakfast  . pantoprazole  40 mg Oral Q0600  . potassium chloride  20 mEq Oral Once  . sodium chloride  3 mL Intravenous Q12H   Continuous:    . DISCONTD: sodium chloride Stopped (09/30/11 0800)     Assessment/Plan: Principal Problem:  *Lower gastrointestinal bleed Active Problems:  OSTEOARTHRITIS, GENERALIZED  Anemia  Problem # 1: LGIB - Presumed recurrent Diverticular  Remains HD stable. Hbg trended  down to 10.4 - 9.7 - 9.8 - 9.8 Flurbiprofen again D/Ced  Appreciate GI involvement.  Type and screen done and did not need transfusion Hold on Surgery/Tagged scan/Embolization unless Life or death. He is doing better.  No more bloody BMs. Advance diet, Hep Lock IV, Get moving.  Hbg is stable and he is functional - OK for D/c   2) URTICARIA (ICD-708.9)  No rashes.  no meds indicated.  OK for OTC Hydrocortisone.   Problem #3: ANEMIA (ICD-285.9)  12.1 last office eval on 08/12/11.  Remains Iron Def.  Bleeding resolving Will follow.  Hbg 11.7 - 11 -10.7 -  10.4 - 9.7  Problem # 4: URINARY TRACT INFECTIONS (ICD-599.0)  S/p Enterococcal UTI/Urosepsis.  Better.  No Sxs.   Problem # 5: ATRIAL FIBRILLATION, PAROXYSMAL (ICD-427.31)  No further eveidence of Sxs.  cannot anticoagulate.  Holding on the cardizem as pulse is great..  NSR and PVCs on monitor.   6 HOH/Hearing loss Wears hearing aides.  7.GERD - PPI  8. DVT prophylaxis - Squeezers.      LOS: 3 days   Laveyah Oriol M 10/01/2011, 6:55 AM

## 2011-10-02 LAB — TYPE AND SCREEN
ABO/RH(D): A POS
Unit division: 0

## 2012-03-06 ENCOUNTER — Other Ambulatory Visit: Payer: Self-pay | Admitting: Orthopedic Surgery

## 2012-03-06 MED ORDER — DEXAMETHASONE SODIUM PHOSPHATE 10 MG/ML IJ SOLN: 10.0000 mg | Freq: Once | INTRAMUSCULAR | Status: DC

## 2012-03-06 MED ORDER — BUPIVACAINE 0.25 % ON-Q PUMP SINGLE CATH 300ML: 300.0000 mL | INJECTION | Status: DC

## 2012-03-06 NOTE — Progress Notes (Signed)
Preoperative surgical orders have been place into the Epic hospital system for [name] on 03/06/2012, 12:01 PM  by Patrica Duel for surgery on 04/03/12.  Preop Total Knee orders including Bupivacaine On-Q pump, IV Tylenol, and IV Decadron as long as there are no contraindications to the above medications. Avel Peace, PA-C

## 2012-03-22 ENCOUNTER — Encounter (HOSPITAL_COMMUNITY): Payer: Self-pay

## 2012-03-30 ENCOUNTER — Encounter (HOSPITAL_COMMUNITY)
Admission: RE | Admit: 2012-03-30 | Discharge: 2012-03-30 | Disposition: A | Discharge status: Home / Self Care | Payer: Medicare Other | Source: Ambulatory Visit | Attending: Orthopedic Surgery | Admitting: Orthopedic Surgery

## 2012-03-30 ENCOUNTER — Encounter (HOSPITAL_COMMUNITY): Payer: Self-pay

## 2012-03-30 HISTORY — DX: Unspecified urinary incontinence: R32

## 2012-03-30 HISTORY — DX: Unspecified hearing loss, unspecified ear: H91.90

## 2012-03-30 HISTORY — DX: Reserved for inherently not codable concepts without codable children: IMO0001

## 2012-03-30 HISTORY — DX: Other abnormalities of gait and mobility: R26.89

## 2012-03-30 LAB — SURGICAL PCR SCREEN: MRSA, PCR: NEGATIVE

## 2012-03-30 LAB — URINALYSIS, ROUTINE W REFLEX MICROSCOPIC
Glucose, UA: NEGATIVE mg/dL
Leukocytes, UA: NEGATIVE
Nitrite: NEGATIVE
Protein, ur: NEGATIVE mg/dL

## 2012-03-30 LAB — CBC
MCH: 31.4 pg (ref 26.0–34.0)
MCHC: 34.3 g/dL (ref 30.0–36.0)
Platelets: 149 10*3/uL — ABNORMAL LOW (ref 150–400)

## 2012-03-30 LAB — COMPREHENSIVE METABOLIC PANEL
ALT: 17 U/L (ref 0–53)
AST: 28 U/L (ref 0–37)
Calcium: 9.7 mg/dL (ref 8.4–10.5)
GFR calc Af Amer: 87 mL/min — ABNORMAL LOW (ref >90)
Glucose, Bld: 92 mg/dL (ref 70–99)
Sodium: 138 mEq/L (ref 135–145)
Total Protein: 6.7 g/dL (ref 6.0–8.3)

## 2012-03-30 LAB — PROTIME-INR: INR: 1.13 (ref 0.00–1.49)

## 2012-03-30 NOTE — Pre-Procedure Instructions (Signed)
Pt given pre op instructions using the teach back method

## 2012-03-30 NOTE — Pre-Procedure Instructions (Addendum)
ekg 2-29-2013 epic Chest 2 view xray 05-12-2011 epic Medical clearance note dr Timothy Lasso on chart

## 2012-03-30 NOTE — Patient Instructions (Signed)
[address] Ven[address]  03/30/2012   Your procedure is scheduled on:  04-03-2012  Report to [facility] at  0730 AM.  Call this number if you have problems the morning of surgery: [phone]   Remember:   Do not eat food or drink liquids:After Midnight.  .  Take these medicines the morning of surgery with A SIP OF WATER: no meds to take   Do not wear jewelry or make up.  Do not wear lotions, powders, or perfumes.Do not wear deodorant.    Do not bring valuables to the hospital.  Contacts, dentures or bridgework may not be worn into surgery.  Leave suitcase in the car. After surgery it may be brought to your room.  For patients admitted to the hospital, checkout time is 11:00 AM the day of discharge                             Patients discharged the day of surgery will not be allowed to drive home. If going home same day of surgery, you must have someone stay with you the first 24 hours at home and arrange for some one to drive you home from hospital.    Special Instructions: CHG Shower Use Special Wash: 1/2 bottle night before surgery and 1/2 bottle morning  of surgery, use regular soap on face and front and back private area. Women do not shave legs or underarms for 2 days before showers. Men may shave face morning of surgery.    Please read over the following fact sheets that you were given: MRSA Information, blood fact sheet, incentive spirometer fact sheet  Cain Sieve [facility] pre op nurse phone number [phone], call if needed

## 2012-04-02 ENCOUNTER — Other Ambulatory Visit: Payer: Self-pay | Admitting: Orthopedic Surgery

## 2012-04-02 NOTE — H&P (Signed)
[name]  DOB: 11/14/1917 Married / Language: English / Race: White Male  Date of Admission:  04/03/2012  Chief Complaint:  Left Knee Pain  History of Present Illness The patient is a 78 year old male who comes in for a preoperative History and Physical. The patient is scheduled for a left total knee arthroplasty to be performed by Dr. Frank V. Aluisio, MD at [facility] on 04/03/2012. The patient is a 78 year old male who presents with knee complaints. The patient reports left knee symptoms including: pain, instability, giving way, weakness and stiffness which began year(s) ago.The patient feels that the symptoms are worsening. The patient has the current diagnosis of knee osteoarthritis. Current treatment includes nonsteroidal anti-inflammatory drugs. [name] has been having a lot of trouble with his left knee. He has not had any trouble in the right knee since having it replaced in Feb of 2012. He has pain in the left knee when he is weightbearing and walking up and down stairs especially. He denies a constant ache. He reports that he ha injections over a year ago and they lasted for maybe 3 weeks. The left knee continues to get worse. He is 78 years old but is in excellent condition. He did extremely well with the right knee which we did at the age of 89. They have been treated conservatively in the past for the above stated problem and despite conservative measures, they continue to have progressive pain and severe functional limitations and dysfunction. They have failed non-operative management. It is felt that they would benefit from undergoing total joint replacement. Risks and benefits of the procedure have been discussed with the patient and they elect to proceed with surgery. There are no active contraindications to surgery such as ongoing infection or rapidly progressive neurological disease.   Problem List/Past Medical Osteoarthritis, Knee (715.96) S/P Right  total knee arthroplasty (V43.65) Impaired Hearing Diverticulosis Urinary Incontinence   Allergies No Known Drug Allergies. 12/30/2011   Family History No pertinent family history   Social History Living situation. live with spouse Marital status. married Illicit drug use. no Drug/Alcohol Rehab (Previously). no Exercise. Exercises weekly; does individual sport and gym / weights Tobacco use. former smoker; smoke(d) less than 1/2 pack(s) per day Number of flights of stairs before winded. 2-3 Pain Contract. no Current work status. retired Drug/Alcohol Rehab (Currently). no Alcohol use. current drinker; drinks beer and wine; only occasionally per week Children. 4 Post-Surgical Plans. Plan to look into skilled unit at [facility].   Medication History No Current Medications.   Past Surgical History Total Knee Replacement. right Cataract Surgery. bilateral Inguinal Hernia Repair. open: bilateral   Review of Systems General:Not Present- Chills, Fever, Night Sweats, Fatigue, Weight Gain, Weight Loss and Memory Loss. Skin:Not Present- Hives, Itching, Rash, Eczema and Lesions. HEENT:Not Present- Tinnitus, Headache, Double Vision, Visual Loss, Hearing Loss and Dentures. Respiratory:Not Present- Shortness of breath with exertion, Shortness of breath at rest, Allergies, Coughing up blood and Chronic Cough. Cardiovascular:Not Present- Chest Pain, Racing/skipping heartbeats, Difficulty Breathing Lying Down, Murmur, Swelling and Palpitations. Gastrointestinal:Not Present- Bloody Stool, Heartburn, Abdominal Pain, Vomiting, Nausea, Constipation, Diarrhea, Difficulty Swallowing, Jaundice and Loss of appetitie. Male Genitourinary:Present- Urinary Retention. Not Present- Urinary frequency, Blood in Urine, Weak urinary stream, Discharge, Flank Pain, Incontinence, Painful Urination, Urgency and Urinating at Night. Musculoskeletal:Present- Muscle Weakness and  Morning Stiffness. Not Present- Muscle Pain, Joint Swelling, Joint Pain, Back Pain and Spasms. Neurological:Not Present- Tremor, Dizziness, Blackout spells, Paralysis, Difficulty with balance and   Weakness. Psychiatric:Not Present- Insomnia.   Vitals Weight: 140 lb Height: 69 in Body Surface Area: 1.76 m Body Mass Index: 20.67 kg/m Pulse: 112 (Regular) Resp.: 14 (Unlabored) BP: 102/64 (Sitting, Right Arm, Standard)    Physical Exam The physical exam findings are as follows:   General Mental Status - Alert, cooperative and good historian. General Appearance- pleasant. Not in acute distress. Orientation- Oriented X3. Build & Nutrition- Well nourished and Well developed.   Head and Neck Head- normocephalic, atraumatic . Neck Global Assessment- supple. no bruit auscultated on the right and no bruit auscultated on the left.   Eye Pupil- Bilateral- Regular and Round. Motion- Bilateral- EOMI. wears glasses  ENMT EarsNote: Bilateral Hearing Aides   Chest and Lung Exam Auscultation: Breath sounds:- clear at anterior chest wall and - clear at posterior chest wall. Adventitious sounds:- No Adventitious sounds.   Cardiovascular Auscultation:Rhythm- Regular rate and rhythm. Heart Sounds- S1 WNL and S2 WNL. Murmurs & Other Heart Sounds: Murmur 1:Location- Mitral Area. Timing- Mid-systolic. Grade- III/VI. Character- Crescendo/Decrescendo.   Abdomen Palpation/Percussion:Tenderness- Abdomen is non-tender to palpation. Rigidity (guarding)- Abdomen is soft. Auscultation:Auscultation of the abdomen reveals - Bowel sounds normal.   Male Genitourinary  Not done, not pertinent to present illness  Musculoskeletal He is alert and oriented, in no apparent distress. The right knee looks great. Range is about 5-125. There is no tenderness or instability. The left knee range is 15-110 with marked crepitus on range of motion. There is a  slight valgus deformity. No instability is noted. No effusion is noted.  RADIOGRAPHS: The X-rays show the prosthesis on the right to be in excellent position. No periprosthetic abnormalities. On the left he has advanced end stage arthritis, tricompartmental, worse laterally with a slight valgus. He has tibial subluxation also.  Assessment & Plan Osteoarthritis, Knee (715.96) Impression: Left Knee  Note: Patient is for a Left Total Knee Replacement by Dr. Aluisio.  patient would like to look into the Skilled unit at [facility] following the surgery.  PCP - Dr. John Russo  Signed electronically by DREW L Jaylon Grode, PA-C 

## 2012-04-03 ENCOUNTER — Inpatient Hospital Stay (HOSPITAL_COMMUNITY)
Admission: RE | Admit: 2012-04-03 | Discharge: 2012-04-07 | DRG: 470 | Disposition: A | Discharge status: Skilled Nursing Facility | Payer: Medicare Other | Source: Ambulatory Visit | Attending: Orthopedic Surgery | Admitting: Orthopedic Surgery

## 2012-04-03 ENCOUNTER — Ambulatory Visit (HOSPITAL_COMMUNITY): Payer: Medicare Other | Admitting: Anesthesiology

## 2012-04-03 ENCOUNTER — Encounter (HOSPITAL_COMMUNITY)
Admission: RE | Disposition: A | Discharge status: Skilled Nursing Facility | Payer: Self-pay | Source: Ambulatory Visit | Attending: Orthopedic Surgery

## 2012-04-03 ENCOUNTER — Encounter (HOSPITAL_COMMUNITY): Payer: Self-pay | Admitting: *Deleted

## 2012-04-03 ENCOUNTER — Encounter (HOSPITAL_COMMUNITY): Payer: Self-pay | Admitting: Anesthesiology

## 2012-04-03 DIAGNOSIS — D500 Iron deficiency anemia secondary to blood loss (chronic): Secondary | ICD-10-CM | POA: Diagnosis not present

## 2012-04-03 DIAGNOSIS — M1710 Unilateral primary osteoarthritis, unspecified knee: Principal | ICD-10-CM | POA: Diagnosis present

## 2012-04-03 DIAGNOSIS — M179 Osteoarthritis of knee, unspecified: Secondary | ICD-10-CM

## 2012-04-03 DIAGNOSIS — Z96659 Presence of unspecified artificial knee joint: Secondary | ICD-10-CM

## 2012-04-03 DIAGNOSIS — E876 Hypokalemia: Secondary | ICD-10-CM | POA: Diagnosis not present

## 2012-04-03 DIAGNOSIS — Z87891 Personal history of nicotine dependence: Secondary | ICD-10-CM

## 2012-04-03 DIAGNOSIS — I739 Peripheral vascular disease, unspecified: Secondary | ICD-10-CM | POA: Diagnosis present

## 2012-04-03 DIAGNOSIS — Z9289 Personal history of other medical treatment: Secondary | ICD-10-CM

## 2012-04-03 DIAGNOSIS — D62 Acute posthemorrhagic anemia: Secondary | ICD-10-CM | POA: Diagnosis not present

## 2012-04-03 DIAGNOSIS — K5730 Diverticulosis of large intestine without perforation or abscess without bleeding: Secondary | ICD-10-CM | POA: Diagnosis present

## 2012-04-03 HISTORY — PX: TOTAL KNEE ARTHROPLASTY: SHX125

## 2012-04-03 HISTORY — DX: Osteoarthritis of knee, unspecified: M17.9

## 2012-04-03 HISTORY — DX: Unilateral primary osteoarthritis, unspecified knee: M17.10

## 2012-04-03 SURGERY — ARTHROPLASTY, KNEE, TOTAL
Anesthesia: Spinal | Site: Knee | Laterality: Left | Wound class: Clean

## 2012-04-03 MED ORDER — ACETAMINOPHEN 10 MG/ML IV SOLN
INTRAVENOUS | Status: DC | PRN
  Administered 2012-04-03: 1000 mg via INTRAVENOUS

## 2012-04-03 MED ORDER — PHENYLEPHRINE HCL 10 MG/ML IJ SOLN
10.0000 mg | INTRAVENOUS | Status: DC | PRN
  Administered 2012-04-03: 40 ug/min via INTRAVENOUS

## 2012-04-03 MED ORDER — ACETAMINOPHEN 10 MG/ML IV SOLN: 1000.0000 mg | Freq: Once | INTRAVENOUS | Status: DC

## 2012-04-03 MED ORDER — BUPIVACAINE HCL 0.75 % IJ SOLN
INTRAMUSCULAR | Status: DC | PRN
  Administered 2012-04-03: 15 mg via INTRATHECAL

## 2012-04-03 MED ORDER — RIVAROXABAN 10 MG PO TABS
10.0000 mg | ORAL_TABLET | Freq: Every day | ORAL | Status: DC
  Administered 2012-04-04 – 2012-04-07 (×4): 10 mg via ORAL
  Filled 2012-04-03 (×5): qty 1

## 2012-04-03 MED ORDER — BUPIVACAINE 0.25 % ON-Q PUMP SINGLE CATH 300ML
INJECTION | Status: AC
  Filled 2012-04-03: qty 300

## 2012-04-03 MED ORDER — POLYETHYLENE GLYCOL 3350 17 G PO PACK: 17.0000 g | PACK | Freq: Every day | ORAL | Status: DC | PRN

## 2012-04-03 MED ORDER — ACETAMINOPHEN 10 MG/ML IV SOLN: 1000.0000 mg | Freq: Once | INTRAVENOUS | Status: DC | PRN

## 2012-04-03 MED ORDER — CHLORHEXIDINE GLUCONATE 4 % EX LIQD
60.0000 mL | Freq: Once | CUTANEOUS | Status: DC
  Filled 2012-04-03: qty 60

## 2012-04-03 MED ORDER — PROPOFOL 10 MG/ML IV EMUL
INTRAVENOUS | Status: DC | PRN
  Administered 2012-04-03: 50 ug/kg/min via INTRAVENOUS

## 2012-04-03 MED ORDER — LACTATED RINGERS IV SOLN
INTRAVENOUS | Status: DC | PRN
  Administered 2012-04-03: 10:00:00 via INTRAVENOUS

## 2012-04-03 MED ORDER — ACETAMINOPHEN 10 MG/ML IV SOLN
INTRAVENOUS | Status: AC
  Filled 2012-04-03: qty 100

## 2012-04-03 MED ORDER — SODIUM CHLORIDE 0.9 % IV SOLN: INTRAVENOUS | Status: DC

## 2012-04-03 MED ORDER — OXYCODONE HCL 5 MG/5ML PO SOLN
5.0000 mg | Freq: Once | ORAL | Status: DC | PRN
  Filled 2012-04-03: qty 5

## 2012-04-03 MED ORDER — CEFAZOLIN SODIUM 1-5 GM-% IV SOLN
1.0000 g | Freq: Four times a day (QID) | INTRAVENOUS | Status: AC
  Administered 2012-04-03 (×2): 1 g via INTRAVENOUS
  Filled 2012-04-03 (×2): qty 50

## 2012-04-03 MED ORDER — BUPIVACAINE 0.25 % ON-Q PUMP SINGLE CATH 300ML
INJECTION | Status: DC | PRN
  Administered 2012-04-03: 300 mL

## 2012-04-03 MED ORDER — HYDROMORPHONE HCL PF 1 MG/ML IJ SOLN: 0.2500 mg | INTRAMUSCULAR | Status: DC | PRN

## 2012-04-03 MED ORDER — 0.9 % SODIUM CHLORIDE (POUR BTL) OPTIME
TOPICAL | Status: DC | PRN
  Administered 2012-04-03: 1000 mL

## 2012-04-03 MED ORDER — METHOCARBAMOL 100 MG/ML IJ SOLN
500.0000 mg | Freq: Four times a day (QID) | INTRAVENOUS | Status: DC | PRN
  Administered 2012-04-03: 500 mg via INTRAVENOUS
  Filled 2012-04-03 (×2): qty 5

## 2012-04-03 MED ORDER — MORPHINE SULFATE 2 MG/ML IJ SOLN
INTRAMUSCULAR | Status: AC
  Administered 2012-04-03: 1 mg via INTRAVENOUS
  Filled 2012-04-03: qty 1

## 2012-04-03 MED ORDER — PROPOFOL 10 MG/ML IV BOLUS
INTRAVENOUS | Status: DC | PRN
  Administered 2012-04-03 (×2): 20 mg via INTRAVENOUS

## 2012-04-03 MED ORDER — DEXTROSE 5 % IV SOLN
3.0000 g | INTRAVENOUS | Status: AC
  Administered 2012-04-03: 2 g via INTRAVENOUS
  Filled 2012-04-03: qty 3000

## 2012-04-03 MED ORDER — PROMETHAZINE HCL 25 MG/ML IJ SOLN: 6.2500 mg | INTRAMUSCULAR | Status: DC | PRN

## 2012-04-03 MED ORDER — MENTHOL 3 MG MT LOZG: 1.0000 | LOZENGE | OROMUCOSAL | Status: DC | PRN

## 2012-04-03 MED ORDER — MEPERIDINE HCL 50 MG/ML IJ SOLN: 6.2500 mg | INTRAMUSCULAR | Status: DC | PRN

## 2012-04-03 MED ORDER — DEXTROSE-NACL 5-0.9 % IV SOLN
INTRAVENOUS | Status: DC
  Administered 2012-04-03 – 2012-04-05 (×3): via INTRAVENOUS

## 2012-04-03 MED ORDER — FLEET ENEMA 7-19 GM/118ML RE ENEM: 1.0000 | ENEMA | Freq: Once | RECTAL | Status: AC | PRN

## 2012-04-03 MED ORDER — ACETAMINOPHEN 325 MG PO TABS: 650.0000 mg | ORAL_TABLET | Freq: Four times a day (QID) | ORAL | Status: DC | PRN

## 2012-04-03 MED ORDER — CEFAZOLIN SODIUM-DEXTROSE 2-3 GM-% IV SOLR
INTRAVENOUS | Status: AC
  Filled 2012-04-03: qty 50

## 2012-04-03 MED ORDER — DOCUSATE SODIUM 100 MG PO CAPS
100.0000 mg | ORAL_CAPSULE | Freq: Two times a day (BID) | ORAL | Status: DC
  Administered 2012-04-03 – 2012-04-07 (×8): 100 mg via ORAL

## 2012-04-03 MED ORDER — PHENOL 1.4 % MT LIQD: 1.0000 | OROMUCOSAL | Status: DC | PRN

## 2012-04-03 MED ORDER — OXYCODONE HCL 5 MG PO TABS: 5.0000 mg | ORAL_TABLET | Freq: Once | ORAL | Status: DC | PRN

## 2012-04-03 MED ORDER — OXYCODONE HCL 5 MG PO TABS
5.0000 mg | ORAL_TABLET | ORAL | Status: DC | PRN
  Administered 2012-04-03: 10 mg via ORAL
  Administered 2012-04-03 – 2012-04-06 (×2): 5 mg via ORAL
  Filled 2012-04-03 (×2): qty 1
  Filled 2012-04-03 (×2): qty 2

## 2012-04-03 MED ORDER — BUPIVACAINE ON-Q PAIN PUMP (FOR ORDER SET NO CHG)
INJECTION | Status: DC
  Filled 2012-04-03: qty 1

## 2012-04-03 MED ORDER — KETOROLAC TROMETHAMINE 15 MG/ML IJ SOLN
15.0000 mg | Freq: Four times a day (QID) | INTRAMUSCULAR | Status: AC
  Administered 2012-04-03 – 2012-04-04 (×3): 15 mg via INTRAVENOUS
  Filled 2012-04-03 (×3): qty 1

## 2012-04-03 MED ORDER — BISACODYL 10 MG RE SUPP
10.0000 mg | Freq: Every day | RECTAL | Status: DC | PRN
  Filled 2012-04-03: qty 1

## 2012-04-03 MED ORDER — LACTATED RINGERS IV SOLN: INTRAVENOUS | Status: DC

## 2012-04-03 MED ORDER — ACETAMINOPHEN 650 MG RE SUPP: 650.0000 mg | Freq: Four times a day (QID) | RECTAL | Status: DC | PRN

## 2012-04-03 MED ORDER — DIPHENHYDRAMINE HCL 12.5 MG/5ML PO ELIX: 12.5000 mg | ORAL_SOLUTION | ORAL | Status: DC | PRN

## 2012-04-03 MED ORDER — METOCLOPRAMIDE HCL 5 MG/ML IJ SOLN: 5.0000 mg | Freq: Three times a day (TID) | INTRAMUSCULAR | Status: DC | PRN

## 2012-04-03 MED ORDER — MORPHINE SULFATE 2 MG/ML IJ SOLN
1.0000 mg | INTRAMUSCULAR | Status: DC | PRN
  Administered 2012-04-03 (×2): 1 mg via INTRAVENOUS
  Filled 2012-04-03: qty 1

## 2012-04-03 MED ORDER — ONDANSETRON HCL 4 MG/2ML IJ SOLN: 4.0000 mg | Freq: Four times a day (QID) | INTRAMUSCULAR | Status: DC | PRN

## 2012-04-03 MED ORDER — ONDANSETRON HCL 4 MG PO TABS: 4.0000 mg | ORAL_TABLET | Freq: Four times a day (QID) | ORAL | Status: DC | PRN

## 2012-04-03 MED ORDER — TRAMADOL HCL 50 MG PO TABS
50.0000 mg | ORAL_TABLET | Freq: Four times a day (QID) | ORAL | Status: DC | PRN
  Administered 2012-04-04: 100 mg via ORAL
  Administered 2012-04-04: 50 mg via ORAL
  Administered 2012-04-05 – 2012-04-07 (×3): 100 mg via ORAL
  Filled 2012-04-03: qty 1
  Filled 2012-04-03 (×4): qty 2

## 2012-04-03 MED ORDER — FENTANYL CITRATE 0.05 MG/ML IJ SOLN
INTRAMUSCULAR | Status: DC | PRN
  Administered 2012-04-03: 25 ug via INTRAVENOUS

## 2012-04-03 MED ORDER — METOCLOPRAMIDE HCL 10 MG PO TABS: 5.0000 mg | ORAL_TABLET | Freq: Three times a day (TID) | ORAL | Status: DC | PRN

## 2012-04-03 MED ORDER — METHOCARBAMOL 500 MG PO TABS
500.0000 mg | ORAL_TABLET | Freq: Four times a day (QID) | ORAL | Status: DC | PRN
  Administered 2012-04-03 – 2012-04-06 (×4): 500 mg via ORAL
  Filled 2012-04-03 (×5): qty 1

## 2012-04-03 MED ORDER — ACETAMINOPHEN 10 MG/ML IV SOLN
1000.0000 mg | Freq: Four times a day (QID) | INTRAVENOUS | Status: AC
  Administered 2012-04-03 – 2012-04-04 (×4): 1000 mg via INTRAVENOUS
  Filled 2012-04-03 (×6): qty 100

## 2012-04-03 SURGICAL SUPPLY — 55 items
BAG SPEC THK2 15X12 ZIP CLS (MISCELLANEOUS) ×1
BAG ZIPLOCK 12X15 (MISCELLANEOUS) ×2 IMPLANT
BANDAGE ELASTIC 6 VELCRO ST LF (GAUZE/BANDAGES/DRESSINGS) ×2 IMPLANT
BANDAGE ESMARK 6X9 LF (GAUZE/BANDAGES/DRESSINGS) ×1 IMPLANT
BLADE SAG 18X100X1.27 (BLADE) ×2 IMPLANT
BLADE SAW SGTL 11.0X1.19X90.0M (BLADE) ×2 IMPLANT
BNDG CMPR 9X6 STRL LF SNTH (GAUZE/BANDAGES/DRESSINGS) ×1
BNDG ESMARK 6X9 LF (GAUZE/BANDAGES/DRESSINGS) ×2
BOWL SMART MIX CTS (DISPOSABLE) ×2 IMPLANT
CATH KIT ON-Q SILVERSOAK 5 (CATHETERS) ×1 IMPLANT
CATH KIT ON-Q SILVERSOAK 5IN (CATHETERS) ×2 IMPLANT
CEMENT HV SMART SET (Cement) ×4 IMPLANT
CLOTH BEACON ORANGE TIMEOUT ST (SAFETY) ×2 IMPLANT
CLSR STERI-STRIP ANTIMIC 1/2X4 (GAUZE/BANDAGES/DRESSINGS) ×2 IMPLANT
CUFF TOURN SGL QUICK 34 (TOURNIQUET CUFF) ×2
CUFF TRNQT CYL 34X4X40X1 (TOURNIQUET CUFF) ×1 IMPLANT
DRAPE EXTREMITY T 121X128X90 (DRAPE) ×2 IMPLANT
DRAPE POUCH INSTRU U-SHP 10X18 (DRAPES) ×2 IMPLANT
DRAPE U-SHAPE 47X51 STRL (DRAPES) ×2 IMPLANT
DRSG ADAPTIC 3X8 NADH LF (GAUZE/BANDAGES/DRESSINGS) ×2 IMPLANT
DRSG PAD ABDOMINAL 8X10 ST (GAUZE/BANDAGES/DRESSINGS) ×1 IMPLANT
DURAPREP 26ML APPLICATOR (WOUND CARE) ×2 IMPLANT
ELECT REM PT RETURN 9FT ADLT (ELECTROSURGICAL) ×2
ELECTRODE REM PT RTRN 9FT ADLT (ELECTROSURGICAL) ×1 IMPLANT
EVACUATOR 1/8 PVC DRAIN (DRAIN) ×2 IMPLANT
FACESHIELD LNG OPTICON STERILE (SAFETY) ×10 IMPLANT
GLOVE BIO SURGEON STRL SZ8 (GLOVE) ×2 IMPLANT
GLOVE BIOGEL PI IND STRL 8 (GLOVE) ×2 IMPLANT
GLOVE BIOGEL PI INDICATOR 8 (GLOVE) ×2
GLOVE ECLIPSE 8.0 STRL XLNG CF (GLOVE) ×2 IMPLANT
GLOVE SURG SS PI 6.5 STRL IVOR (GLOVE) ×4 IMPLANT
GOWN STRL NON-REIN LRG LVL3 (GOWN DISPOSABLE) ×4 IMPLANT
GOWN STRL REIN XL XLG (GOWN DISPOSABLE) ×2 IMPLANT
HANDPIECE INTERPULSE COAX TIP (DISPOSABLE) ×2
IMMOBILIZER KNEE 20 (SOFTGOODS) ×2
IMMOBILIZER KNEE 20 THIGH 36 (SOFTGOODS) ×1 IMPLANT
KIT BASIN OR (CUSTOM PROCEDURE TRAY) ×2 IMPLANT
MANIFOLD NEPTUNE II (INSTRUMENTS) ×2 IMPLANT
NS IRRIG 1000ML POUR BTL (IV SOLUTION) ×2 IMPLANT
PACK TOTAL JOINT (CUSTOM PROCEDURE TRAY) ×2 IMPLANT
PAD ABD 7.5X8 STRL (GAUZE/BANDAGES/DRESSINGS) ×2 IMPLANT
PADDING CAST COTTON 6X4 STRL (CAST SUPPLIES) ×2 IMPLANT
POSITIONER SURGICAL ARM (MISCELLANEOUS) ×2 IMPLANT
SET HNDPC FAN SPRY TIP SCT (DISPOSABLE) ×1 IMPLANT
SPONGE GAUZE 4X4 12PLY (GAUZE/BANDAGES/DRESSINGS) ×2 IMPLANT
STRIP CLOSURE SKIN 1/2X4 (GAUZE/BANDAGES/DRESSINGS) ×4 IMPLANT
SUCTION FRAZIER 12FR DISP (SUCTIONS) ×2 IMPLANT
SUT MNCRL AB 4-0 PS2 18 (SUTURE) ×2 IMPLANT
SUT VIC AB 2-0 CT1 27 (SUTURE) ×6
SUT VIC AB 2-0 CT1 TAPERPNT 27 (SUTURE) ×3 IMPLANT
SUT VLOC 180 0 24IN GS25 (SUTURE) ×2 IMPLANT
TOWEL OR 17X26 10 PK STRL BLUE (TOWEL DISPOSABLE) ×4 IMPLANT
TRAY FOLEY CATH 14FRSI W/METER (CATHETERS) ×2 IMPLANT
WATER STERILE IRR 1500ML POUR (IV SOLUTION) ×2 IMPLANT
WRAP KNEE MAXI GEL POST OP (GAUZE/BANDAGES/DRESSINGS) ×4 IMPLANT

## 2012-04-03 NOTE — Op Note (Signed)
Pre-operative diagnosis- Osteoarthritis  Left knee(s)  Post-operative diagnosis- Osteoarthritis Left knee(s)  Procedure-  Left  Total Knee Arthroplasty  Surgeon- Gerald Rankin. Deshun Sedivy, MD  Assistant- Worthy Rancher, MD   Anesthesia-  Spinal EBL-* No blood loss amount entered *  Drains Hemovac  Tourniquet time- 34 minutes @ Hg Complications- None  Condition-PACU - hemodynamically stable.   Brief Clinical Note   [name] is a 78 y.o. year old male with end stage OA of his left knee with progressively worsening pain and dysfunction. He has constant pain, with activity and at rest and significant functional deficits with difficulties even with ADLs. He has had extensive non-op management including analgesics, injections of cortisone and viscosupplements, and home exercise program, but remains in significant pain with significant dysfunction. Radiographs show bone on bone arthritis lateral and patellofemoral. He presents now for left Total Knee Arthroplasty.    Procedure in detail---   The patient is brought into the operating room and positioned supine on the operating table. After successful administration of  Spinal,   a tourniquet is placed high on the  Left thigh(s) and the lower extremity is prepped and draped in the usual sterile fashion. Time out is performed by the operating team and then the  Left lower extremity is wrapped in Esmarch, knee flexed and the tourniquet inflated to 300 mmHg.       A midline incision is made with a ten blade through the subcutaneous tissue to the level of the extensor mechanism. A fresh blade is used to make a medial parapatellar arthrotomy. Soft tissue over the proximal medial tibia is subperiosteally elevated to the joint line with a knife and into the semimembranosus bursa with a Cobb elevator. Soft tissue over the proximal lateral tibia is elevated with attention being paid to avoiding the patellar tendon on the tibial tubercle. The patella is  everted, knee flexed 90 degrees and the ACL and PCL are removed. Findings are bone on bone all 3 compartments with massive global osteophytes.        The drill is used to create a starting hole in the distal femur and the canal is thoroughly irrigated with sterile saline to remove the fatty contents. The 5 degree Left  valgus alignment guide is placed into the femoral canal and the distal femoral cutting block is pinned to remove 11 mm off the distal femur. Resection is made with an oscillating saw.      The tibia is subluxed forward and the menisci are removed. The extramedullary alignment guide is placed referencing proximally at the medial aspect of the tibial tubercle and distally along the second metatarsal axis and tibial crest. The block is pinned to remove 2mm off the more deficient lateral  side. Resection is made with an oscillating saw. Size 4is the most appropriate size for the tibia and the proximal tibia is prepared with the modular drill and keel punch for that size.      The femoral sizing guide is placed and size 4 is most appropriate. Rotation is marked off the epicondylar axis and confirmed by creating a rectangular flexion gap at 90 degrees. The size 4 cutting block is pinned in this rotation and the anterior, posterior and chamfer cuts are made with the oscillating saw. The intercondylar block is then placed and that cut is made.      Trial size 4 tibial component, trial size 4 posterior stabilized femur and a 12.5  mm posterior stabilized rotating platform insert  trial is placed. Full extension is achieved with excellent varus/valgus and anterior/posterior balance throughout full range of motion. The patella is everted and thickness measured to be 25  mm. Free hand resection is taken to 15 mm, a 38 template is placed, lug holes are drilled, trial patella is placed, and it tracks normally. Osteophytes are removed off the posterior femur with the trial in place. All trials are removed and the  cut bone surfaces prepared with pulsatile lavage. Cement is mixed and once ready for implantation, the size 4 tibial implant, size  4 posterior stabilized femoral component, and the size 38 patella are cemented in place and the patella is held with the clamp. The trial insert is placed and the knee held in full extension. All extruded cement is removed and once the cement is hard the permanent 12.5 mm posterior stabilized rotating platform insert is placed into the tibial tray.      The wound is copiously irrigated with saline solution and the extensor mechanism closed over a hemovac drain with #1 PDS suture. The tourniquet is released for a total tourniquet time of 34  minutes. Flexion against gravity is 130 degrees and the patella tracks normally. Subcutaneous tissue is closed with 2.0 vicryl and subcuticular with running 4.0 Monocryl. The catheter for the Marcaine pain pump is placed and the pump is initiated. The incision is cleaned and dried and steri-strips and a bulky sterile dressing are applied. The limb is placed into a knee immobilizer and the patient is awakened and transported to recovery in stable condition.      Please note that a surgical assistant was a medical necessity for this procedure in order to perform it in a safe and expeditious manner. Surgical assistant was necessary to retract the ligaments and vital neurovascular structures to prevent injury to them and also necessary for proper positioning of the limb to allow for anatomic placement of the prosthesis.   Gerald Rankin Leyla Soliz, MD    04/03/2012, 11:24 AM

## 2012-04-03 NOTE — Addendum Note (Signed)
Addendum  created 04/03/12 1251 by Gaylan Gerold, MD   Modules edited:Anesthesia Attestations

## 2012-04-03 NOTE — Transfer of Care (Signed)
Immediate Anesthesia Transfer of Care Note  Patient: [name]  Procedure(s) Performed: Procedure(s) (LRB): TOTAL KNEE ARTHROPLASTY (Left)  Patient Location: PACU  Anesthesia Type: Regional  Level of Consciousness: sedated, patient cooperative and responds to stimulaton  Airway & Oxygen Therapy: Patient Spontanous Breathing and Patient connected to face mask oxgen  Post-op Assessment: Report given to PACU RN and Post -op Vital signs reviewed and stable  Post vital signs: Reviewed and stable  Complications: No apparent anesthesia complications Noted T12 level on exam on release to PACU staff.

## 2012-04-03 NOTE — Anesthesia Postprocedure Evaluation (Signed)
Anesthesia Post Note  Patient: [name]  Procedure(s) Performed: Procedure(s) (LRB): TOTAL KNEE ARTHROPLASTY (Left)  Anesthesia type: Spinal  Patient location: PACU  Post pain: Pain level controlled  Post assessment: Post-op Vital signs reviewed  Last Vitals: BP 115/63  Pulse 53  Temp 36 C  Resp 10  SpO2 100%  Post vital signs: Reviewed  Level of consciousness: sedated  Complications: No apparent anesthesia complications

## 2012-04-03 NOTE — Anesthesia Preprocedure Evaluation (Addendum)
Anesthesia Evaluation  Patient identified by MRN, date of birth, ID band Patient awake    Reviewed: Allergy & Precautions, H&P , NPO status , Patient's Chart, lab work & pertinent test results  History of Anesthesia Complications Negative for: history of anesthetic complications  Airway Mallampati: II      Dental  (+) Teeth Intact and Dental Advisory Given   Pulmonary neg pneumonia -,  breath sounds clear to auscultation  Pulmonary exam normal       Cardiovascular + dysrhythmias Atrial Fibrillation Rhythm:Regular Rate:Normal     Neuro/Psych    GI/Hepatic Neg liver ROS, PUD,   Endo/Other  negative endocrine ROS  Renal/GU negative Renal ROS     Musculoskeletal   Abdominal   Peds  Hematology  (+) Blood dyscrasia, anemia ,   Anesthesia Other Findings   Reproductive/Obstetrics                         Anesthesia Physical Anesthesia Plan  ASA: III  Anesthesia Plan: Spinal   Post-op Pain Management:    Induction:   Airway Management Planned: Simple Face Mask  Additional Equipment:   Intra-op Plan:   Post-operative Plan:   Informed Consent: I have reviewed the patients History and Physical, chart, labs and discussed the procedure including the risks, benefits and alternatives for the proposed anesthesia with the patient or authorized representative who has indicated his/her understanding and acceptance.   Dental advisory given  Plan Discussed with: CRNA and Surgeon  Anesthesia Plan Comments:         Anesthesia Quick Evaluation

## 2012-04-03 NOTE — Interval H&P Note (Signed)
History and Physical Interval Note:  04/03/2012 8:59 AM  [name]  has presented today for surgery, with the diagnosis of osteoarthritis left knee  The various methods of treatment have been discussed with the patient and family. After consideration of risks, benefits and other options for treatment, the patient has consented to  Procedure(s) (LRB): TOTAL KNEE ARTHROPLASTY (Left) as a surgical intervention .  The patient's history has been reviewed, patient examined, no change in status, stable for surgery.  I have reviewed the patient's chart and labs.  Questions were answered to the patient's satisfaction.     Loanne Drilling

## 2012-04-03 NOTE — Addendum Note (Signed)
Addendum  created 04/03/12 1251 by Korion Cuevas R Shailee Foots, MD   Modules edited:Anesthesia Attestations    

## 2012-04-03 NOTE — H&P (View-Only) (Signed)
[name]  DOB: 1918-06-18 Married / Language: English / Race: White Male  Date of Admission:  04/03/2012  Chief Complaint:  Left Knee Pain  History of Present Illness The patient is a 78 year old male who comes in for a preoperative History and Physical. The patient is scheduled for a left total knee arthroplasty to be performed by Dr. Gus Rankin. Aluisio, MD at [facility] on 04/03/2012. The patient is a 78 year old male who presents with knee complaints. The patient reports left knee symptoms including: pain, instability, giving way, weakness and stiffness which began year(s) ago.The patient feels that the symptoms are worsening. The patient has the current diagnosis of knee osteoarthritis. Current treatment includes nonsteroidal anti-inflammatory drugs. [name] has been having a lot of trouble with his left knee. He has not had any trouble in the right knee since having it replaced in Feb of 2012. He has pain in the left knee when he is weightbearing and walking up and down stairs especially. He denies a constant ache. He reports that he ha injections over a year ago and they lasted for maybe 3 weeks. The left knee continues to get worse. He is 78 years old but is in excellent condition. He did extremely well with the right knee which we did at the age of 89. They have been treated conservatively in the past for the above stated problem and despite conservative measures, they continue to have progressive pain and severe functional limitations and dysfunction. They have failed non-operative management. It is felt that they would benefit from undergoing total joint replacement. Risks and benefits of the procedure have been discussed with the patient and they elect to proceed with surgery. There are no active contraindications to surgery such as ongoing infection or rapidly progressive neurological disease.   Problem List/Past Medical Osteoarthritis, Knee (715.96) S/P Right  total knee arthroplasty (V43.65) Impaired Hearing Diverticulosis Urinary Incontinence   Allergies No Known Drug Allergies. 12/30/2011   Family History No pertinent family history   Social History Living situation. live with spouse Marital status. married Illicit drug use. no Drug/Alcohol Rehab (Previously). no Exercise. Exercises weekly; does individual sport and gym / weights Tobacco use. former smoker; smoke(d) less than 1/2 pack(s) per day Number of flights of stairs before winded. 2-3 Pain Contract. no Current work status. retired Financial planner (Currently). no Alcohol use. current drinker; drinks beer and wine; only occasionally per week Children. 4 Post-Surgical Plans. Plan to look into skilled unit at [facility].   Medication History No Current Medications.   Past Surgical History Total Knee Replacement. right Cataract Surgery. bilateral Inguinal Hernia Repair. open: bilateral   Review of Systems General:Not Present- Chills, Fever, Night Sweats, Fatigue, Weight Gain, Weight Loss and Memory Loss. Skin:Not Present- Hives, Itching, Rash, Eczema and Lesions. HEENT:Not Present- Tinnitus, Headache, Double Vision, Visual Loss, Hearing Loss and Dentures. Respiratory:Not Present- Shortness of breath with exertion, Shortness of breath at rest, Allergies, Coughing up blood and Chronic Cough. Cardiovascular:Not Present- Chest Pain, Racing/skipping heartbeats, Difficulty Breathing Lying Down, Murmur, Swelling and Palpitations. Gastrointestinal:Not Present- Bloody Stool, Heartburn, Abdominal Pain, Vomiting, Nausea, Constipation, Diarrhea, Difficulty Swallowing, Jaundice and Loss of appetitie. Male Genitourinary:Present- Urinary Retention. Not Present- Urinary frequency, Blood in Urine, Weak urinary stream, Discharge, Flank Pain, Incontinence, Painful Urination, Urgency and Urinating at Night. Musculoskeletal:Present- Muscle Weakness and  Morning Stiffness. Not Present- Muscle Pain, Joint Swelling, Joint Pain, Back Pain and Spasms. Neurological:Not Present- Tremor, Dizziness, Blackout spells, Paralysis, Difficulty with balance and  Weakness. Psychiatric:Not Present- Insomnia.   Vitals Weight: 140 lb Height: 69 in Body Surface Area: 1.76 m Body Mass Index: 20.67 kg/m Pulse: 112 (Regular) Resp.: 14 (Unlabored) BP: 102/64 (Sitting, Right Arm, Standard)    Physical Exam The physical exam findings are as follows:   General Mental Status - Alert, cooperative and good historian. General Appearance- pleasant. Not in acute distress. Orientation- Oriented X3. Build & Nutrition- Well nourished and Well developed.   Head and Neck Head- normocephalic, atraumatic . Neck Global Assessment- supple. no bruit auscultated on the right and no bruit auscultated on the left.   Eye Pupil- Bilateral- Regular and Round. Motion- Bilateral- EOMI. wears glasses  ENMT EarsNote: Bilateral Hearing Aides   Chest and Lung Exam Auscultation: Breath sounds:- clear at anterior chest wall and - clear at posterior chest wall. Adventitious sounds:- No Adventitious sounds.   Cardiovascular Auscultation:Rhythm- Regular rate and rhythm. Heart Sounds- S1 WNL and S2 WNL. Murmurs & Other Heart Sounds: Murmur 1:Location- Mitral Area. Timing- Mid-systolic. Grade- III/VI. Character- Crescendo/Decrescendo.   Abdomen Palpation/Percussion:Tenderness- Abdomen is non-tender to palpation. Rigidity (guarding)- Abdomen is soft. Auscultation:Auscultation of the abdomen reveals - Bowel sounds normal.   Male Genitourinary  Not done, not pertinent to present illness  Musculoskeletal He is alert and oriented, in no apparent distress. The right knee looks great. Range is about 5-125. There is no tenderness or instability. The left knee range is 15-110 with marked crepitus on range of motion. There is a  slight valgus deformity. No instability is noted. No effusion is noted.  RADIOGRAPHS: The X-rays show the prosthesis on the right to be in excellent position. No periprosthetic abnormalities. On the left he has advanced end stage arthritis, tricompartmental, worse laterally with a slight valgus. He has tibial subluxation also.  Assessment & Plan Osteoarthritis, Knee (715.96) Impression: Left Knee  Note: Patient is for a Left Total Knee Replacement by Dr. Lequita Halt.  patient would like to look into the Skilled unit at [facility] following the surgery.  PCP - Dr. Creola Corn  Signed electronically by Roberts Gaudy, PA-C

## 2012-04-03 NOTE — Anesthesia Procedure Notes (Signed)
Spinal  Patient location during procedure: OR Start time: 04/03/2012 10:07 AM End time: 04/03/2012 10:17 AM Staffing CRNA/Resident: Paris Lore Performed by: resident/CRNA  Preanesthetic Checklist Completed: patient identified, site marked, surgical consent, pre-op evaluation, timeout performed, IV checked, risks and benefits discussed and monitors and equipment checked Spinal Block Patient position: sitting Prep: Betadine Patient monitoring: heart rate, continuous pulse ox and blood pressure Approach: right paramedian Location: L3-4 Injection technique: single-shot Needle Needle type: Spinocan  Needle gauge: 24 G Needle length: 9 cm Additional Notes Expiration date of kit checked and confirmed. Patient tolerated procedure well, without complications.

## 2012-04-04 DIAGNOSIS — D500 Iron deficiency anemia secondary to blood loss (chronic): Secondary | ICD-10-CM | POA: Diagnosis not present

## 2012-04-04 LAB — CBC
MCH: 31.9 pg (ref 26.0–34.0)
MCHC: 34.9 g/dL (ref 30.0–36.0)
MCV: 91.5 fL (ref 78.0–100.0)
Platelets: 104 10*3/uL — ABNORMAL LOW (ref 150–400)
RDW: 13.8 % (ref 11.5–15.5)

## 2012-04-04 LAB — BASIC METABOLIC PANEL
CO2: 27 mEq/L (ref 19–32)
Calcium: 8.5 mg/dL (ref 8.4–10.5)
Creatinine, Ser: 0.86 mg/dL (ref 0.50–1.35)
GFR calc Af Amer: 85 mL/min — ABNORMAL LOW (ref >90)
GFR calc non Af Amer: 74 mL/min — ABNORMAL LOW (ref >90)
Sodium: 135 mEq/L (ref 135–145)

## 2012-04-04 MED ORDER — SODIUM CHLORIDE 0.9 % IV BOLUS (SEPSIS)
500.0000 mL | Freq: Once | INTRAVENOUS | Status: AC
  Administered 2012-04-04: 500 mL via INTRAVENOUS

## 2012-04-04 NOTE — Progress Notes (Signed)
Clinical Social Work Department BRIEF PSYCHOSOCIAL ASSESSMENT 04/04/2012  Patient:  [name], [name]     Account Number:  [id number]     Admit date:  04/03/2012  Clinical Social Worker:  Candie Chroman  Date/Time:  04/04/2012 02:55 PM  Referred by:  Physician  Date Referred:  04/04/2012 Referred for  SNF Placement   Other Referral:   Interview type:  Patient Other interview type:    PSYCHOSOCIAL DATA Living Status:  WIFE Admitted from facility:   Level of care:   Primary support name:  Mosetta Pigeon Primary support relationship to patient:  CHILD, ADULT Degree of support available:   supportive    CURRENT CONCERNS Current Concerns  Post-Acute Placement   Other Concerns:    SOCIAL WORK ASSESSMENT / PLAN Pt is a 78 yr old genleman from [facility]. CSW met with pt and family to assist with d/c planning. Pt will have rehab at [facility] following hospital d/c. D/C has been confirmed with facility. CSW# will follo to assist with d/c planning needs.   Assessment/plan status:  Psychosocial Support/Ongoing Assessment of Needs Other assessment/ plan:   Information/referral to community resources:   None needed at this time.    PATIENT'S/FAMILY'S RESPONSE TO PLAN OF CARE: Pt is looking forward to returning to [facility] for rehab.    Cori Razor LCSW [phone]

## 2012-04-04 NOTE — Progress Notes (Signed)
Physical Therapy Treatment Patient Details Name: [name] MRN: [id number] DOB: 05/12/1918 Today's Date: 04/04/2012 Time: 4098-1191 PT Time Calculation (min): 20 min  PT Assessment / Plan / Recommendation Comments on Treatment Session  Assisted pt out of recliner to amb to BR per pt request to "use the facilities".  Then amb pt back to bed.  Pt c/o mod fatigue. Pt plans to return [facility].    Follow Up Recommendations  Skilled nursing facility    Barriers to Discharge        Equipment Recommendations  Defer to next venue    Recommendations for Other Services    Frequency 7X/week   Plan Discharge plan remains appropriate    Precautions / Restrictions   L KI for OOB act until able to perform 10 active SLR  Pertinent Vitals/Pain C/o "some" knee pain    Mobility  Bed Mobility Bed Mobility: Sit to Supine Sit to Supine: 1: +2 Total assist Sit to Supine: Patient Percentage: 70% Details for Bed Mobility Assistance: mod assist to support L LE up onto bed  Transfers Transfers: Sit to Stand;Stand to Sit Sit to Stand: 1: +2 Total assist;From toilet;From chair/3-in-1 Sit to Stand: Patient Percentage: 60% Stand to Sit: To toilet;To bed;1: +2 Total assist Stand to Sit: Patient Percentage: 60% Details for Transfer Assistance: 75% VC's on proper technique and hand placement  Ambulation/Gait Ambulation/Gait Assistance: 1: +2 Total assist Ambulation/Gait: Patient Percentage: 70% Ambulation Distance (Feet): 26 Feet Assistive device: Rolling walker Ambulation/Gait Assistance Details: amb from recliner to BR then to bed.  Pt required 50% VC's on sequencing and proper walker to self distance.  Very shaky, unsteady gait. Gait Pattern: Step-to pattern;Decreased stance time - left;Trunk flexed Gait velocity: decreased    PT Goals                    progressing    Visit Information  Last PT Received On: 04/04/12 Assistance Needed: +2    Subjective Data  Subjective: I need to use the facilities Patient Stated Goal: n/a   Cognition    good   Balance   poor  End of Session PT - End of Session Equipment Utilized During Treatment: Gait belt;Left knee immobilizer Activity Tolerance: Patient tolerated treatment well Patient left: in bed;with call bell/phone within reach Nurse Communication: Other (comment) (pt had BM)   Felecia Shelling  PTA [facility]  Acute  Rehab Pager     [phone]

## 2012-04-04 NOTE — Progress Notes (Signed)
Physical Therapy Evaluation Patient Details Name: [name] MRN: [id number] DOB: [birth year] Today's Date: 04/04/2012 Time: 4401-0272 PT Time Calculation (min): 30 min  PT Assessment / Plan / Recommendation Clinical Impression  Pt s/p L TKR presents with decreased L LE strength/ROM and limitations in functional mobility    PT Assessment  Patient needs continued PT services    Follow Up Recommendations  Skilled nursing facility    Barriers to Discharge        Equipment Recommendations  Defer to next venue    Recommendations for Other Services OT consult   Frequency 7X/week    Precautions / Restrictions Precautions Precautions: Knee Required Braces or Orthoses: Knee Immobilizer - Left Knee Immobilizer - Left: Discontinue once straight leg raise with < 10 degree lag Restrictions Weight Bearing Restrictions: Yes LLE Weight Bearing: Weight bearing as tolerated   Pertinent Vitals/Pain 3/10; premedicated, cold packs provided      Mobility  Bed Mobility Bed Mobility: Supine to Sit Supine to Sit: 4: Min assist Details for Bed Mobility Assistance: min cues for sequence Transfers Transfers: Sit to Stand;Stand to Sit Sit to Stand: 4: Min assist;3: Mod assist Stand to Sit: 4: Min assist;3: Mod assist Details for Transfer Assistance: cues for use of UEs and for LE management Ambulation/Gait Ambulation/Gait Assistance: 1: +2 Total assist Ambulation/Gait: Patient Percentage: 70% Ambulation Distance (Feet): 56 Feet Assistive device: Rolling walker Ambulation/Gait Assistance Details: cues for posture, sequence, position from RW Gait Pattern: Step-to pattern    Exercises Total Joint Exercises Ankle Circles/Pumps: AROM;10 reps;Supine;Both Quad Sets: AROM;10 reps;Both;Supine Heel Slides: AAROM;10 reps;Supine;Left Straight Leg Raises: AAROM;10 reps;Supine;Left   PT Diagnosis: Difficulty walking  PT Problem List: Decreased strength;Decreased range of motion;Decreased  activity tolerance;Decreased mobility;Decreased knowledge of use of DME;Pain PT Treatment Interventions: DME instruction;Gait training;Stair training;Functional mobility training;Therapeutic activities;Therapeutic exercise;Patient/family education   PT Goals Acute Rehab PT Goals PT Goal Formulation: With patient Time For Goal Achievement: 04/10/12 Potential to Achieve Goals: Good Pt will go Supine/Side to Sit: with supervision PT Goal: Supine/Side to Sit - Progress: Goal set today Pt will go Sit to Supine/Side: with supervision PT Goal: Sit to Supine/Side - Progress: Goal set today Pt will go Sit to Stand: with supervision PT Goal: Sit to Stand - Progress: Goal set today Pt will go Stand to Sit: with supervision PT Goal: Stand to Sit - Progress: Goal set today Pt will Ambulate: 51 - 150 feet;with supervision;with rolling walker PT Goal: Ambulate - Progress: Goal set today  Visit Information  Last PT Received On: 04/04/12 Assistance Needed: +2    Subjective Data  Subjective: It hurts a little Patient Stated Goal: To be good by Christmas   Prior Functioning  Home Living Lives With: Spouse Prior Function Level of Independence: Independent with assistive device(s) Able to Take Stairs?: Yes Communication Communication: HOH    Cognition  Overall Cognitive Status: Appears within functional limits for tasks assessed/performed Arousal/Alertness: Awake/alert Orientation Level: Appears intact for tasks assessed Behavior During Session: [facility] for tasks performed    Extremity/Trunk Assessment Right Upper Extremity Assessment RUE ROM/Strength/Tone: [facility] for tasks assessed Left Upper Extremity Assessment LUE ROM/Strength/Tone: [facility] for tasks assessed Right Lower Extremity Assessment RLE ROM/Strength/Tone: [facility] for tasks assessed Left Lower Extremity Assessment LLE ROM/Strength/Tone: Deficits LLE ROM/Strength/Tone Deficits: 3-/5 Quads, AAROM at knee -15 - 75   Balance    End of Session  PT - End of Session Equipment Utilized During Treatment: Left knee immobilizer Activity Tolerance: Patient tolerated treatment well Patient left: in  chair;with call bell/phone within reach Nurse Communication: Mobility status CPM Left Knee CPM Left Knee: Off  GP     Marvis Saefong 04/04/2012, 11:47 AM

## 2012-04-04 NOTE — Progress Notes (Signed)
   Subjective: 1 Day Post-Op Procedure(s) (LRB): TOTAL KNEE ARTHROPLASTY (Left) Patient reports pain as moderate and severe last night.  Better this morning   Patient seen in rounds with Dr. Lequita Halt. Patient is well, but has had some minor complaints of pain in the knee, requiring pain medications We will start therapy today.  Plan is to go Skilled nursing facility after hospital stay.  Objective: Vital signs in last 24 hours: Temp:  [96.8 F (36 C)-98.1 F (36.7 C)] 97.6 F (36.4 C) (08/27 0544) Pulse Rate:  [53-72] 71  (08/27 0544) Resp:  [9-18] 16  (08/27 0544) BP: (86-118)/(40-71) 112/71 mmHg (08/27 0544) SpO2:  [94 %-100 %] 98 % (08/27 0544) Weight:  [65.318 kg (144 lb)] 65.318 kg (144 lb) (08/26 1504)  Intake/Output from previous day:  Intake/Output Summary (Last 24 hours) at 04/04/12 0801 Last data filed at 04/04/12 0646  Gross per 24 hour  Intake   4320 ml  Output   2010 ml  Net   2310 ml    Intake/Output this shift:    Labs:  Basename 04/04/12 0439  HGB 10.5*    Basename 04/04/12 0439  WBC 6.4  RBC 3.29*  HCT 30.1*  PLT 104*    Basename 04/04/12 0439  NA 135  K 3.7  CL 103  CO2 27  BUN 17  CREATININE 0.86  GLUCOSE 124*  CALCIUM 8.5   No results found for this basename: LABPT:2,INR:2 in the last 72 hours  EXAM General - Patient is Alert, Appropriate and Oriented Extremity - Neurovascular intact Sensation intact distally Dorsiflexion/Plantar flexion intact Dressing - dressing C/D/I Motor Function - intact, moving foot and toes well on exam.  Hemovac pulled without difficulty.  Past Medical History  Diagnosis Date  . Complication of anesthesia     aspirated during cardioversion  . Peripheral vascular disease     external hemroids  . GI bleeding 2012  . Diverticular disease   . Impaired hearing     both ears  . Dysrhythmia      a- fib history, no current cardiologist  . Pneumonia oct 2012     gives history of  . Anemia   .  Urinary incontinence   . Balance problem     due to knee    Assessment/Plan: 1 Day Post-Op Procedure(s) (LRB): TOTAL KNEE ARTHROPLASTY (Left) Principal Problem:  *OA (osteoarthritis) of knee Active Problems:  Expected Blood loss anemia   Advance diet Up with therapy Continue foley due to strict I&O and urinary output monitoring Discharge to SNF  DVT Prophylaxis - Xarelto Weight-Bearing as tolerated to left leg No vaccines. D/C O2 and Pulse OX and try on Room Air  Jaquaya Coyle 04/04/2012, 8:01 AM

## 2012-04-04 NOTE — Progress Notes (Signed)
Clinical Social Work Department BRIEF PSYCHOSOCIAL ASSESSMENT 04/04/2012  Patient:  JAMAREA, SELNER     Account Number:  [id number]     Admit date:  04/03/2012  Clinical Social Worker:  Candie Chroman  Date/Time:  04/04/2012 02:55 PM  Referred by:  Physician  Date Referred:  04/04/2012 Referred for  SNF Placement   Other Referral:   Interview type:  Patient Other interview type:    PSYCHOSOCIAL DATA Living Status:  WIFE Admitted from facility:   Level of care:   Primary support name:  Mosetta Pigeon Primary support relationship to patient:  CHILD, ADULT Degree of support available:   supportive    CURRENT CONCERNS Current Concerns  Post-Acute Placement   Other Concerns:    SOCIAL WORK ASSESSMENT / PLAN Pt is a 78 yr old genleman from [facility]. CSW met with pt and family to assist with d/c planning. Pt will have rehab at [facility] following hospital d/c. D/C has been confirmed with facility. CSW will follow to assist with d/c planning needs.   Assessment/plan status:  Psychosocial Support/Ongoing Assessment of Needs Other assessment/ plan:   Information/referral to community resources:   None needed at this time.    PATIENT'S/FAMILY'S RESPONSE TO PLAN OF CARE: Pt is looking forward to returning to [facility] for rehab.   Cori Razor LCSW [phone]

## 2012-04-04 NOTE — Progress Notes (Signed)
Clinical Social Work Department CLINICAL SOCIAL WORK PLACEMENT NOTE 04/04/2012  Patient:  [name], [name]  Account Number:  [id number] Admit date:  04/03/2012  Clinical Social Worker:  Cori Razor, LCSW  Date/time:  04/04/2012 03:06 PM  Clinical Social Work is seeking post-discharge placement for this patient at the following level of care:   SKILLED NURSING   (*CSW will update this form in Epic as items are completed)     Patient/family provided with [facility] Department of Clinical Social Work's list of facilities offering this level of care within the geographic area requested by the patient (or if unable, by the patient's family).    Patient/family informed of their freedom to choose among providers that offer the needed level of care, that participate in Medicare, Medicaid or managed care program needed by the patient, have an available bed and are willing to accept the patient.    Patient/family informed of [facility]' ownership interest in [facility], as well as of the fact that they are under no obligation to receive care at this facility.  PASARR submitted to EDS on  PASARR number received from EDS on   FL2 transmitted to all facilities in geographic area requested by pt/family on  04/04/2012 FL2 transmitted to all facilities within larger geographic area on   Patient informed that his/her managed care company has contracts with or will negotiate with  certain facilities, including the following:     Patient/family informed of bed offers received:  04/04/2012 Patient chooses bed at [facility] Physician recommends and patient chooses bed at    Patient to be transferred to  on   Patient to be transferred to facility by   The following physician request were entered in Epic:   Additional Comments: Passar is not required at [facility].  Cori Razor LCSW [phone]

## 2012-04-05 LAB — CBC
Hemoglobin: 9 g/dL — ABNORMAL LOW (ref 13.0–17.0)
MCH: 31.7 pg (ref 26.0–34.0)
Platelets: 94 10*3/uL — ABNORMAL LOW (ref 150–400)
RBC: 2.84 MIL/uL — ABNORMAL LOW (ref 4.22–5.81)

## 2012-04-05 LAB — BASIC METABOLIC PANEL
CO2: 23 mEq/L (ref 19–32)
Calcium: 8.2 mg/dL — ABNORMAL LOW (ref 8.4–10.5)
GFR calc non Af Amer: 77 mL/min — ABNORMAL LOW (ref >90)
Potassium: 3.4 mEq/L — ABNORMAL LOW (ref 3.5–5.1)
Sodium: 135 mEq/L (ref 135–145)

## 2012-04-05 MED ORDER — POTASSIUM CHLORIDE CRYS ER 20 MEQ PO TBCR
40.0000 meq | EXTENDED_RELEASE_TABLET | Freq: Two times a day (BID) | ORAL | Status: AC
  Administered 2012-04-05 (×2): 40 meq via ORAL
  Filled 2012-04-05 (×2): qty 2

## 2012-04-05 MED ORDER — FUROSEMIDE 10 MG/ML IJ SOLN
10.0000 mg | Freq: Once | INTRAMUSCULAR | Status: AC
  Administered 2012-04-05: 10 mg via INTRAVENOUS
  Filled 2012-04-05: qty 1

## 2012-04-05 NOTE — Progress Notes (Signed)
OT Screen Order received, chart reviewed. Pt plan is for d/c to snf and is requiring +2 A for mobility. Will sign off and defer OT eval to that venue.  Garrel Ridgel, OTR/L  Pager [phone] 04/05/2012

## 2012-04-05 NOTE — Progress Notes (Signed)
Physical Therapy Treatment Patient Details Name: [name] MRN: [id number] DOB: Dec 19, 1917 Today's Date: 04/05/2012 Time: 1420-1440 PT Time Calculation (min): 20 min  PT Assessment / Plan / Recommendation Comments on Treatment Session  Assisted pt out of recliner back to bed to perform TKR TE's.    Follow Up Recommendations  Skilled nursing facility    Barriers to Discharge        Equipment Recommendations  Defer to next venue    Recommendations for Other Services    Frequency 7X/week   Plan Discharge plan remains appropriate    Precautions / Restrictions Precautions Precautions: Fall;Knee Precaution Comments: Instructed pt on KI use for amb Required Braces or Orthoses: Knee Immobilizer - Left Knee Immobilizer - Left: Discontinue once straight leg raise with < 10 degree lag Restrictions Weight Bearing Restrictions: No LLE Weight Bearing: Weight bearing as tolerated    Pertinent Vitals/Pain C/o 6/10 L knee pain during TE's Applied ICE    Mobility  Bed Mobility Bed Mobility: Supine to Sit Supine to Sit: 4: Min assist Sit to Supine: 2: Max assist Details for Bed Mobility Assistance: mod assist to support L LE up into bed  Transfers Transfers: Sit to Stand;Stand to Sit Sit to Stand: 1: +2 Total assist;From chair/3-in-1 Sit to Stand: Patient Percentage: 50% Stand to Sit: 1: +2 Total assist;To bed Stand to Sit: Patient Percentage: 50% Details for Transfer Assistance: 75% VC's on proper technique and hand placement and turn complletion.  Very shaky, unsteady.  Ambulation/Gait Assistance Details: Pt declined to amb again, c/o max fatigue after using BSC with nursing.     Exercises Total Joint Exercises Ankle Circles/Pumps: AROM;Both;10 reps;Supine Quad Sets: AROM;Both;10 reps;Supine Gluteal Sets: AROM;Both;10 reps;Supine Towel Squeeze: AROM;Both;10 reps;Supine Short Arc Quad: AAROM;Left;10 reps;Supine Heel Slides: AAROM;Left;10 reps;Supine Hip  ABduction/ADduction: AAROM;Left;10 reps;Supine Straight Leg Raises: AAROM;Left;10 reps;Supine   PT Goals                        progressing   Visit Information  Last PT Received On: 04/05/12 Assistance Needed: +2               Balance   poor  End of Session PT - End of Session Equipment Utilized During Treatment: Gait belt Activity Tolerance: Patient tolerated treatment well Patient left: in bed;with call bell/phone within reach Nurse Communication: Patient requests pain meds  Felecia Shelling  PTA [facility]  Acute  Rehab Pager     [phone]

## 2012-04-05 NOTE — Progress Notes (Signed)
Physical Therapy Treatment Patient Details Name: [name] MRN: [id number] DOB: 1918-06-03 Today's Date: 04/05/2012 Time: 0865-7846 PT Time Calculation (min): 15 min  PT Assessment / Plan / Recommendation Comments on Treatment Session  Assisted pt OOB to amb in hallway then position in recliner.  Family came in just as we were assisting pt OOB.  Pt plans to D/C to [facility] for Rehab.    Follow Up Recommendations  Skilled nursing facility    Barriers to Discharge        Equipment Recommendations  Defer to next venue    Recommendations for Other Services    Frequency 7X/week   Plan Discharge plan remains appropriate    Precautions / Restrictions Precautions Precautions: Fall;Knee Precaution Comments: Instructed pt on KI use for amb Required Braces or Orthoses: Knee Immobilizer - Left Knee Immobilizer - Left: Discontinue once straight leg raise with < 10 degree lag Restrictions Weight Bearing Restrictions: No LLE Weight Bearing: Weight bearing as tolerated    Pertinent Vitals/Pain C/o 6/10 L knee pain    Mobility  Bed Mobility Bed Mobility: Supine to Sit Supine to Sit: 4: Min assist Details for Bed Mobility Assistance: min assist to support L LE off bed and increased time  Transfers Transfers: Sit to Stand;Stand to Sit Sit to Stand: 1: +2 Total assist;From bed Sit to Stand: Patient Percentage: 60% Stand to Sit: 1: +2 Total assist;To chair/3-in-1 Stand to Sit: Patient Percentage: 60% Details for Transfer Assistance: 75% VC's on proper technique and hand placement   Ambulation/Gait Ambulation/Gait Assistance: 1: +2 Total assist Ambulation/Gait: Patient Percentage: 70% Ambulation Distance (Feet): 65 Feet Assistive device: Rolling walker Ambulation/Gait Assistance Details: 75% VC's on sequencing and proper walker to self distance.  Very shaky, unsteady gait with ataxic mvts. Gait Pattern: Step-to pattern;Narrow base of support;Decreased stance time -  left Gait velocity: decreased     PT Goals                          progressing   Visit Information  Last PT Received On: 04/05/12 Assistance Needed: +2                   End of Session PT - End of Session Equipment Utilized During Treatment: Gait belt;Left knee immobilizer Activity Tolerance: Patient tolerated treatment well Patient left: in chair;with call bell/phone within reach;with family/visitor present Nurse Communication: Patient requests pain meds  Felecia Shelling  PTA [facility]  Acute  Rehab Pager     [phone]

## 2012-04-05 NOTE — Progress Notes (Addendum)
   Subjective: 2 Days Post-Op Procedure(s) (LRB): TOTAL KNEE ARTHROPLASTY (Left) Patient reports pain as mild and moderate.  Pain is better today as compared to yesterday. Patient seen in rounds for Dr. Lequita Halt. Patient is well, and has had no acute complaints or problems Plan is to go Skilled nursing facility after hospital stay.  Objective: Vital signs in last 24 hours: Temp:  [97.7 F (36.5 C)-98 F (36.7 C)] 97.9 F (36.6 C) (08/28 0500) Pulse Rate:  [62-86] 77  (08/28 0500) Resp:  [16-20] 16  (08/28 0500) BP: (91-125)/(51-68) 119/68 mmHg (08/28 0500) SpO2:  [93 %-99 %] 97 % (08/28 0500)  Intake/Output from previous day:  Intake/Output Summary (Last 24 hours) at 04/05/12 0932 Last data filed at 04/05/12 0700  Gross per 24 hour  Intake 1881.67 ml  Output    701 ml  Net 1180.67 ml    Intake/Output this shift:    Labs:  Basename 04/05/12 0420 04/04/12 0439  HGB 9.0* 10.5*    Basename 04/05/12 0420 04/04/12 0439  WBC 6.8 6.4  RBC 2.84* 3.29*  HCT 25.6* 30.1*  PLT 94* 104*    Basename 04/05/12 0420 04/04/12 0439  NA 135 135  K 3.4* 3.7  CL 105 103  CO2 23 27  BUN 22 17  CREATININE 0.78 0.86  GLUCOSE 113* 124*  CALCIUM 8.2* 8.5   No results found for this basename: LABPT:2,INR:2 in the last 72 hours  EXAM General - Patient is Alert, Appropriate and Oriented and hard of hearing Extremity - Neurovascular intact Sensation intact distally Dorsiflexion/Plantar flexion intact No cellulitis present Dressing/Incision - clean, healing and bloody drainage on the dressing Motor Function - intact, moving foot and toes well on exam.   Past Medical History  Diagnosis Date  . Complication of anesthesia     aspirated during cardioversion  . Peripheral vascular disease     external hemroids  . GI bleeding 2012  . Diverticular disease   . Impaired hearing     both ears  . Dysrhythmia      a- fib history, no current cardiologist  . Pneumonia oct 2012   gives history of  . Anemia   . Urinary incontinence   . Balance problem     due to knee    Assessment/Plan: 2 Days Post-Op Procedure(s) (LRB): TOTAL KNEE ARTHROPLASTY (Left) Principal Problem:  *OA (osteoarthritis) of knee Active Problems:  Hypokalemia  Expected Blood loss anemia   Up with therapy Discharge to SNF DC Foley DVT Prophylaxis - Xarelto Weight-Bearing as tolerated to left leg K-Dur for hypokalemia PERKINS, ALEXZANDREW 04/05/2012, 9:32 AM

## 2012-04-06 ENCOUNTER — Encounter (HOSPITAL_COMMUNITY): Payer: Self-pay | Admitting: Orthopedic Surgery

## 2012-04-06 DIAGNOSIS — Z9289 Personal history of other medical treatment: Secondary | ICD-10-CM

## 2012-04-06 LAB — PREPARE RBC (CROSSMATCH)

## 2012-04-06 LAB — BASIC METABOLIC PANEL
CO2: 24 mEq/L (ref 19–32)
Chloride: 101 mEq/L (ref 96–112)
Glucose, Bld: 119 mg/dL — ABNORMAL HIGH (ref 70–99)
Sodium: 133 mEq/L — ABNORMAL LOW (ref 135–145)

## 2012-04-06 LAB — CBC
Hemoglobin: 7.6 g/dL — ABNORMAL LOW (ref 13.0–17.0)
Platelets: 90 10*3/uL — ABNORMAL LOW (ref 150–400)
RBC: 2.41 MIL/uL — ABNORMAL LOW (ref 4.22–5.81)
WBC: 6.1 10*3/uL (ref 4.0–10.5)

## 2012-04-06 MED ORDER — RIVAROXABAN 10 MG PO TABS: 10.0000 mg | ORAL_TABLET | Freq: Every day | ORAL | Status: DC

## 2012-04-06 MED ORDER — TRAMADOL HCL 50 MG PO TABS: 50.0000 mg | ORAL_TABLET | Freq: Four times a day (QID) | ORAL | Status: AC | PRN

## 2012-04-06 MED ORDER — BISACODYL 10 MG RE SUPP: 10.0000 mg | Freq: Every day | RECTAL | Status: AC | PRN

## 2012-04-06 MED ORDER — FUROSEMIDE 10 MG/ML IJ SOLN
10.0000 mg | Freq: Once | INTRAMUSCULAR | Status: AC
  Administered 2012-04-06: 10 mg via INTRAVENOUS
  Filled 2012-04-06: qty 1

## 2012-04-06 MED ORDER — METHOCARBAMOL 500 MG PO TABS: 500.0000 mg | ORAL_TABLET | Freq: Four times a day (QID) | ORAL | Status: AC | PRN

## 2012-04-06 MED ORDER — DSS 100 MG PO CAPS: 100.0000 mg | ORAL_CAPSULE | Freq: Two times a day (BID) | ORAL | Status: AC

## 2012-04-06 MED ORDER — ACETAMINOPHEN 10 MG/ML IV SOLN
1000.0000 mg | Freq: Once | INTRAVENOUS | Status: AC
  Administered 2012-04-06: 1000 mg via INTRAVENOUS
  Filled 2012-04-06: qty 100

## 2012-04-06 MED ORDER — BISACODYL 10 MG RE SUPP
10.0000 mg | Freq: Once | RECTAL | Status: AC
  Administered 2012-04-06: 10 mg via RECTAL

## 2012-04-06 MED ORDER — ONDANSETRON HCL 4 MG PO TABS: 4.0000 mg | ORAL_TABLET | Freq: Four times a day (QID) | ORAL | Status: AC | PRN

## 2012-04-06 MED ORDER — OXYCODONE HCL 5 MG PO TABS: 5.0000 mg | ORAL_TABLET | ORAL | Status: AC | PRN

## 2012-04-06 NOTE — Progress Notes (Signed)
EKG performed @ bedside per order.

## 2012-04-06 NOTE — Discharge Summary (Addendum)
Physician Discharge Summary   Patient ID: [name] MRN: [id number] DOB/AGE: 1917-09-07 78 y.o.  Admit date: 04/03/2012 Discharge date: 04/07/2012  Primary Diagnosis: Osteoarthritis Left knee   Admission Diagnoses:  Past Medical History  Diagnosis Date  . Complication of anesthesia     aspirated during cardioversion  . Peripheral vascular disease     external hemroids  . GI bleeding 2012  . Diverticular disease   . Impaired hearing     both ears  . Dysrhythmia      a- fib history, no current cardiologist  . Pneumonia oct 2012     gives history of  . Anemia   . Urinary incontinence   . Balance problem     due to knee   Discharge Diagnoses:   Principal Problem:  *OA (osteoarthritis) of knee Active Problems:  Hypokalemia  Acute Blood loss anemia  Postop Transfusion  Procedure:  Procedure(s) (LRB): TOTAL KNEE ARTHROPLASTY (Left)   Consults: None  HPI: [name] is a 78 y.o. year old male with end stage OA of his left knee with progressively worsening pain and dysfunction. He has constant pain, with activity and at rest and significant functional deficits with difficulties even with ADLs. He has had extensive non-op management including analgesics, injections of cortisone and viscosupplements, and home exercise program, but remains in significant pain with significant dysfunction. Radiographs show bone on bone arthritis lateral and patellofemoral. He presents now for left Total Knee Arthroplasty.   Laboratory Data: Hospital Outpatient Visit on 03/30/2012  Component Date Value Range Status  . aPTT 03/30/2012 54* 24 - 37 seconds Final   Comment:                                 IF BASELINE aPTT IS ELEVATED,                          SUGGEST PATIENT RISK ASSESSMENT                          BE USED TO DETERMINE APPROPRIATE                          ANTICOAGULANT THERAPY.  . WBC 03/30/2012 5.8  4.0 - 10.5 K/uL Final  . RBC 03/30/2012 4.52  4.22 - 5.81 MIL/uL Final   . Hemoglobin 03/30/2012 14.2  13.0 - 17.0 g/dL Final  . HCT 08/65/7846 41.4  39.0 - 52.0 % Final  . MCV 03/30/2012 91.6  78.0 - 100.0 fL Final  . MCH 03/30/2012 31.4  26.0 - 34.0 pg Final  . MCHC 03/30/2012 34.3  30.0 - 36.0 g/dL Final  . RDW 96/29/5284 14.1  11.5 - 15.5 % Final  . Platelets 03/30/2012 149* 150 - 400 K/uL Final  . Sodium 03/30/2012 138  135 - 145 mEq/L Final  . Potassium 03/30/2012 4.3  3.5 - 5.1 mEq/L Final  . Chloride 03/30/2012 102  96 - 112 mEq/L Final  . CO2 03/30/2012 27  19 - 32 mEq/L Final  . Glucose, Bld 03/30/2012 92  70 - 99 mg/dL Final  . BUN 13/24/4010 18  6 - 23 mg/dL Final  . Creatinine, Ser 03/30/2012 0.81  0.50 - 1.35 mg/dL Final  . Calcium 27/25/3664 9.7  8.4 - 10.5 mg/dL Final  . Total Protein 03/30/2012 6.7  6.0 - 8.3 g/dL Final  . Albumin 16/05/9603 3.8  3.5 - 5.2 g/dL Final  . AST 54/04/8118 28  0 - 37 U/L Final  . ALT 03/30/2012 17  0 - 53 U/L Final  . Alkaline Phosphatase 03/30/2012 67  39 - 117 U/L Final  . Total Bilirubin 03/30/2012 1.0  0.3 - 1.2 mg/dL Final  . GFR calc non Af Amer 03/30/2012 75* >90 mL/min Final  . GFR calc Af Amer 03/30/2012 87* >90 mL/min Final   Comment:                                 The eGFR has been calculated                          using the CKD EPI equation.                          This calculation has not been                          validated in all clinical                          situations.                          eGFR's persistently                          <90 mL/min signify                          possible Chronic Kidney Disease.  Marland Kitchen Prothrombin Time 03/30/2012 14.7  11.6 - 15.2 seconds Final  . INR 03/30/2012 1.13  0.00 - 1.49 Final  . Color, Urine 03/30/2012 YELLOW  YELLOW Final  . APPearance 03/30/2012 CLEAR  CLEAR Final  . Specific Gravity, Urine 03/30/2012 1.020  1.005 - 1.030 Final  . pH 03/30/2012 6.5  5.0 - 8.0 Final  . Glucose, UA 03/30/2012 NEGATIVE  NEGATIVE mg/dL Final  . Hgb  urine dipstick 03/30/2012 SMALL* NEGATIVE Final  . Bilirubin Urine 03/30/2012 NEGATIVE  NEGATIVE Final  . Ketones, ur 03/30/2012 NEGATIVE  NEGATIVE mg/dL Final  . Protein, ur 14/78/2956 NEGATIVE  NEGATIVE mg/dL Final  . Urobilinogen, UA 03/30/2012 1.0  0.0 - 1.0 mg/dL Final  . Nitrite 21/30/8657 NEGATIVE  NEGATIVE Final  . Leukocytes, UA 03/30/2012 NEGATIVE  NEGATIVE Final  . MRSA, PCR 03/30/2012 NEGATIVE  NEGATIVE Final  . Staphylococcus aureus 03/30/2012 NEGATIVE  NEGATIVE Final   Comment:                                 The Xpert SA Assay (FDA                          approved for NASAL specimens                          in patients over 49 years of age),  is one component of                          a comprehensive surveillance                          program.  Test performance has                          been validated by [facility] for patients greater                          than or equal to 72 year old.                          It is not intended                          to diagnose infection nor to                          guide or monitor treatment.  . RBC / HPF 03/30/2012 0-2  <3 RBC/hpf Final    Basename 04/06/12 0440 04/05/12 0420 04/04/12 0439  HGB 7.6* 9.0* 10.5*    Basename 04/06/12 0440 04/05/12 0420  WBC 6.1 6.8  RBC 2.41* 2.84*  HCT 21.8* 25.6*  PLT 90* 94*    Basename 04/06/12 0440 04/05/12 0420  NA 133* 135  K 3.8 3.4*  CL 101 105  CO2 24 23  BUN 18 22  CREATININE 0.72 0.78  GLUCOSE 119* 113*  CALCIUM 8.1* 8.2*   No results found for this basename: LABPT:2,INR:2 in the last 72 hours  X-Rays:No results found.  EKG: Orders placed during the hospital encounter of 09/28/11  . EKG     Hospital Course: Patient was admitted to [facility] and taken to the OR and underwent the above state procedure without complications.  Patient tolerated the procedure well and was later transferred to  the recovery room and then to the orthopaedic floor for postoperative care.  They were given PO and IV analgesics for pain control following their surgery.  They were given 24 hours of postoperative antibiotics and started on DVT prophylaxis in the form of Xarelto.   PT and OT were ordered for total joint protocol.  Discharge planning consulted to help with postop disposition and equipment needs.  Patient had a rough night on the evening of surgery due to pain but was much better the next morning and started to get up OOB with therapy on day one.    Hemovac drain was pulled without difficulty. Placed on potassium supplement.  Continued to work with therapy into day two.   Dressing was changed on day two and the incision was healing well.  By day three, the patient had progressed with therapy and meeting their goals.  Patient was well, and has had no acute complaints or problems except for a low HGB noted early that morning. HGB was down to 7.6 and blood was ordered.  Incision was healing well.  Patient was seen in rounds and was ready to go to SNF after the blood  is completed.  Addendum: The blood ran long and he was not able to go over to SNF late in the evening.  Discharge was held until today.  He was doing fairly well on rounds.  Plan for transfer today.  Summary and orders as below.  Repeat CBC and BMET are pending at time of this addendum.  Will still recheck neck week to ensure HGB stability.  Discharge Medications: Prior to Admission medications   Medication Sig Start Date End Date Taking? Authorizing Provider  acetaminophen (TYLENOL) 325 MG tablet Take 325 mg by mouth every 6 (six) hours as needed. 10/01/11 09/30/12 Yes Gwen Pounds, MD  ARTIFICIAL TEAR OP Place 1 drop into the right eye 2 (two) times daily as needed. Dryness   Yes Historical Provider, MD  bisacodyl (DULCOLAX) 10 MG suppository Place 1 suppository (10 mg total) rectally daily as needed. 04/06/12 04/16/12  Edia Pursifull, PA    docusate sodium 100 MG CAPS Take 100 mg by mouth 2 (two) times daily. 04/06/12 04/16/12  Hailynn Slovacek, PA  methocarbamol (ROBAXIN) 500 MG tablet Take 1 tablet (500 mg total) by mouth every 6 (six) hours as needed. 04/06/12 04/16/12  Ajeet Casasola, PA  ondansetron (ZOFRAN) 4 MG tablet Take 1 tablet (4 mg total) by mouth every 6 (six) hours as needed for nausea. 04/06/12 04/13/12  Daytona Hedman, PA  oxyCODONE (OXY IR/ROXICODONE) 5 MG immediate release tablet Take 1-2 tablets (5-10 mg total) by mouth every 4 (four) hours as needed for pain. 04/06/12 04/16/12  Jeffery Bachmeier Julien Girt, PA  rivaroxaban (XARELTO) 10 MG TABS tablet Take 1 tablet (10 mg total) by mouth daily with breakfast. Take Xarelto for two and a half more weeks, then discontinue Xarelto. 04/06/12   Danarius Mcconathy Julien Girt, PA  traMADol (ULTRAM) 50 MG tablet Take 1-2 tablets (50-100 mg total) by mouth every 6 (six) hours as needed. 04/06/12 04/16/12  Gildardo Tickner, PA    Diet: Regular diet Activity:WBAT Follow-up:in 2 weeks Disposition - Skilled nursing facility - [facility] Discharged Condition: good  **Will ask the SNF to recheck a CBC on Monday or Tuesday to follow up on the ABLA and transfusion.**  Discharge Orders    Future Orders Please Complete By Expires   Diet general      Call MD / Call 911      Comments:   If you experience chest pain or shortness of breath, CALL 911 and be transported to the hospital emergency room.  If you develope a fever above 101 F, pus (white drainage) or increased drainage or redness at the wound, or calf pain, call your surgeon's office.   Discharge instructions      Comments:   Pick up stool softner and laxative for home. Do not submerge incision under water. May shower. Continue to use ice for pain and swelling from surgery.  Take Xarelto for two and a half more weeks, then discontinue Xarelto.   Constipation Prevention      Comments:   Drink plenty of fluids.  Prune juice may be  helpful.  You may use a stool softener, such as Colace (over the counter) 100 mg twice a day.  Use MiraLax (over the counter) for constipation as needed.   Increase activity slowly as tolerated      Patient may shower      Comments:   You may shower without a dressing once there is no drainage.  Do not wash over the wound.  If drainage remains, do not shower until drainage  stops.   Driving restrictions      Comments:   No driving until released by the physician.   Lifting restrictions      Comments:   No lifting until released by the physician.   TED hose      Comments:   Use stockings (TED hose) for 3 weeks on both leg(s).  You may remove them at night for sleeping.   Change dressing      Comments:   Change dressing daily with sterile 4 x 4 inch gauze dressing and apply TED hose. Do not submerge the incision under water.   Do not put a pillow under the knee. Place it under the heel.      Do not sit on low chairs, stoools or toilet seats, as it may be difficult to get up from low surfaces        Medication List  As of 04/06/2012  8:55 AM   STOP taking these medications         COD LIVER OIL PO         TAKE these medications         acetaminophen 325 MG tablet   Commonly known as: TYLENOL   Take 325 mg by mouth every 6 (six) hours as needed.      ARTIFICIAL TEAR OP   Place 1 drop into the right eye 2 (two) times daily as needed. Dryness      bisacodyl 10 MG suppository   Commonly known as: DULCOLAX   Place 1 suppository (10 mg total) rectally daily as needed.      DSS 100 MG Caps   Take 100 mg by mouth 2 (two) times daily.      methocarbamol 500 MG tablet   Commonly known as: ROBAXIN   Take 1 tablet (500 mg total) by mouth every 6 (six) hours as needed.      ondansetron 4 MG tablet   Commonly known as: ZOFRAN   Take 1 tablet (4 mg total) by mouth every 6 (six) hours as needed for nausea.      oxyCODONE 5 MG immediate release tablet   Commonly known as: Oxy  IR/ROXICODONE   Take 1-2 tablets (5-10 mg total) by mouth every 4 (four) hours as needed for pain.      rivaroxaban 10 MG Tabs tablet   Commonly known as: XARELTO   Take 1 tablet (10 mg total) by mouth daily with breakfast. Take Xarelto for two and a half more weeks, then discontinue Xarelto.      traMADol 50 MG tablet   Commonly known as: ULTRAM   Take 1-2 tablets (50-100 mg total) by mouth every 6 (six) hours as needed.           Follow-up Information    Follow up with Loanne Drilling, MD. Schedule an appointment as soon as possible for a visit in 2 weeks.   Contact information:   [facility] [address] [place] [address] [phone]          Signed: Patrica Duel 04/06/2012, 8:55 AM

## 2012-04-06 NOTE — Progress Notes (Signed)
Physical Therapy Treatment Patient Details Name: [name] MRN: [id number] DOB: 08-28-1917 Today's Date: 04/06/2012 Time: 6578-4696 PT Time Calculation (min): 28 min  PT Assessment / Plan / Recommendation Comments on Treatment Session  Pt finished first unit of blood, so assisted OOB to amb. Toerated well.  Pt plans to D/C to [facility] level for ST Rehab.    Follow Up Recommendations  Skilled nursing facility    Barriers to Discharge        Equipment Recommendations  Defer to next venue    Recommendations for Other Services    Frequency 7X/week   Plan Discharge plan remains appropriate    Precautions / Restrictions Precautions Precautions: Fall;Knee Precaution Comments: Instructed pt on KI use for amb Required Braces or Orthoses: Knee Immobilizer - Left Knee Immobilizer - Left: Discontinue once straight leg raise with < 10 degree lag Restrictions Weight Bearing Restrictions: No LLE Weight Bearing: Weight bearing as tolerated    Pertinent Vitals/Pain C/o "some" pain    Mobility  Bed Mobility Bed Mobility: Supine to Sit Supine to Sit: 3: Mod assist Details for Bed Mobility Assistance: Mod assist to support L LE off bed and increased time  Transfers Transfers: Sit to Stand;Stand to Sit Sit to Stand: 1: +2 Total assist;From bed Sit to Stand: Patient Percentage: 50% Stand to Sit: 1: +2 Total assist;To chair/3-in-1 Stand to Sit: Patient Percentage: 50% Details for Transfer Assistance: 75% VC's on proper technique and hand placement and turn complletion. Very shaky, unsteady.   Ambulation/Gait Ambulation/Gait Assistance: 1: +2 Total assist Ambulation/Gait: Patient Percentage: 60% Ambulation Distance (Feet): 75 Feet Assistive device: Rolling walker Ambulation/Gait Assistance Details: 75% VC's on sequencing and proper walker to self distance.  Very unsteady, shaky gait.  HIGH FALL RISK. Gait Pattern: Step-to pattern;Decreased stance time - left;Narrow base of  support;Trunk flexed Gait velocity: decreased     PT Goals          progressing    Visit Information  Last PT Received On: 04/06/12 Assistance Needed: +2                   End of Session PT - End of Session Equipment Utilized During Treatment: Gait belt;Left knee immobilizer Activity Tolerance: Patient limited by fatigue Patient left: in chair;with call bell/phone within reach   Felecia Shelling  PTA [facility]  Acute  Rehab Pager     [phone]

## 2012-04-06 NOTE — Progress Notes (Signed)
   Subjective: 3 Days Post-Op Procedure(s) (LRB): TOTAL KNEE ARTHROPLASTY (Left) Patient reports pain as mild.   Patient seen in rounds with Dr. Lequita Halt. Patient is well, and has had no acute complaints or problems except for a low HGB noted early this morning.  HGB was down to 7.6 and blood was ordered. Patient is ready to go to SNF after the blood is completed.  Objective: Vital signs in last 24 hours: Temp:  [98.6 F (37 C)-98.7 F (37.1 C)] 98.6 F (37 C) (08/29 0436) Pulse Rate:  [91-96] 91  (08/29 0436) Resp:  [14-16] 14  (08/29 0436) BP: (103-123)/(50-66) 104/63 mmHg (08/29 0436) SpO2:  [91 %-96 %] 91 % (08/29 0436)  Intake/Output from previous day:  Intake/Output Summary (Last 24 hours) at 04/06/12 0832 Last data filed at 04/06/12 0437  Gross per 24 hour  Intake   1020 ml  Output   1325 ml  Net   -305 ml    Intake/Output this shift:    Labs:  Basename 04/06/12 0440 04/05/12 0420 04/04/12 0439  HGB 7.6* 9.0* 10.5*    Basename 04/06/12 0440 04/05/12 0420  WBC 6.1 6.8  RBC 2.41* 2.84*  HCT 21.8* 25.6*  PLT 90* 94*    Basename 04/06/12 0440 04/05/12 0420  NA 133* 135  K 3.8 3.4*  CL 101 105  CO2 24 23  BUN 18 22  CREATININE 0.72 0.78  GLUCOSE 119* 113*  CALCIUM 8.1* 8.2*   No results found for this basename: LABPT:2,INR:2 in the last 72 hours  EXAM: General - Patient is Alert, Appropriate and Oriented Extremity - Neurovascular intact Sensation intact distally Dorsiflexion/Plantar flexion intact No cellulitis present Incision - clean, dry, no drainage, healing Motor Function - intact, moving foot and toes well on exam.   Assessment/Plan: 3 Days Post-Op Procedure(s) (LRB): TOTAL KNEE ARTHROPLASTY (Left) Procedure(s) (LRB): TOTAL KNEE ARTHROPLASTY (Left) Past Medical History  Diagnosis Date  . Complication of anesthesia     aspirated during cardioversion  . Peripheral vascular disease     external hemroids  . GI bleeding 2012  .  Diverticular disease   . Impaired hearing     both ears  . Dysrhythmia      a- fib history, no current cardiologist  . Pneumonia oct 2012     gives history of  . Anemia   . Urinary incontinence   . Balance problem     due to knee   Principal Problem:  *OA (osteoarthritis) of knee Active Problems:  Hypokalemia  Acute Blood loss anemia  Postop Transfusion   Discharge to SNF Diet - Regular diet Follow up - in 2 weeks Activity - WBAT Disposition - Skilled nursing facility Condition Upon Discharge - Good D/C Meds - See DC Summary DVT Prophylaxis - Xarelto  Syrenity Klepacki 04/06/2012, 8:32 AM

## 2012-04-07 LAB — TYPE AND SCREEN
ABO/RH(D): A POS
Unit division: 0
Unit division: 0

## 2012-04-07 LAB — CBC
MCH: 31.4 pg (ref 26.0–34.0)
MCV: 90 fL (ref 78.0–100.0)
Platelets: 130 10*3/uL — ABNORMAL LOW (ref 150–400)
RBC: 3.31 MIL/uL — ABNORMAL LOW (ref 4.22–5.81)
RDW: 14.1 % (ref 11.5–15.5)

## 2012-04-07 LAB — BASIC METABOLIC PANEL
CO2: 26 mEq/L (ref 19–32)
Calcium: 8.8 mg/dL (ref 8.4–10.5)
Creatinine, Ser: 0.71 mg/dL (ref 0.50–1.35)
GFR calc Af Amer: >90 mL/min (ref >90)
GFR calc non Af Amer: 80 mL/min — ABNORMAL LOW (ref >90)
Sodium: 134 mEq/L — ABNORMAL LOW (ref 135–145)

## 2012-04-07 NOTE — Progress Notes (Signed)
Clinical Social Work Department CLINICAL SOCIAL WORK PLACEMENT NOTE 04/07/2012  Patient:  [name], [name]  Account Number:  [id number] Admit date:  04/03/2012  Clinical Social Worker:  Cori Razor, LCSW  Date/time:  04/04/2012 03:06 PM  Clinical Social Work is seeking post-discharge placement for this patient at the following level of care:   SKILLED NURSING   (*CSW will update this form in Epic as items are completed)     Patient/family provided with [facility] Department of Clinical Social Work's list of facilities offering this level of care within the geographic area requested by the patient (or if unable, by the patient's family).    Patient/family informed of their freedom to choose among providers that offer the needed level of care, that participate in Medicare, Medicaid or managed care program needed by the patient, have an available bed and are willing to accept the patient.    Patient/family informed of [facility]' ownership interest in [facility], as well as of the fact that they are under no obligation to receive care at this facility.  PASARR submitted to EDS on  PASARR number received from EDS on   FL2 transmitted to all facilities in geographic area requested by pt/family on  04/04/2012 FL2 transmitted to all facilities within larger geographic area on   Patient informed that his/her managed care company has contracts with or will negotiate with  certain facilities, including the following:     Patient/family informed of bed offers received:  04/04/2012 Patient chooses bed at [facility] Physician recommends and patient chooses bed at    Patient to be transferred to [facility] on  04/07/2012 Patient to be transferred to facility by [facility]  The following physician request were entered in Epic:   Additional Comments: Passar is not required at [facility].  Cori Razor LCSW [phone]

## 2012-04-07 NOTE — Progress Notes (Signed)
Physical Therapy Treatment Patient Details Name: LYNARD POSTLEWAIT MRN: [id number] DOB: 1918/05/19 Today's Date: 04/07/2012 Time: 1000-1025 PT Time Calculation (min): 25 min  PT Assessment / Plan / Recommendation Comments on Treatment Session  Pt stated he feels much better today.  Had a BM. Planning to D/C to [facility] today.      Follow Up Recommendations  Skilled nursing facility    Barriers to Discharge        Equipment Recommendations  Defer to next venue    Recommendations for Other Services    Frequency 7X/week   Plan Discharge plan remains appropriate    Precautions / Restrictions Precautions Precautions: Fall;Knee Required Braces or Orthoses: Knee Immobilizer - Left Knee Immobilizer - Left: Discontinue once straight leg raise with < 10 degree lag Restrictions Weight Bearing Restrictions: No LLE Weight Bearing: Weight bearing as tolerated    Pertinent Vitals/Pain C/o "soreness"    Mobility  Bed Mobility Bed Mobility: Not assessed Details for Bed Mobility Assistance: Pt OOB in recliner  Transfers Transfers: Sit to Stand;Stand to Sit Sit to Stand: 3: Mod assist;From chair/3-in-1 Stand to Sit: 3: Mod assist;To chair/3-in-1 Details for Transfer Assistance: 75% VC's on proper technique and hand placement.    Ambulation/Gait Ambulation/Gait Assistance: 3: Mod assist Ambulation Distance (Feet): 85 Feet Assistive device: Rolling walker Ambulation/Gait Assistance Details: 50% VC's on proper walker to self distance and chair following behind for safety.  Pt still demon a shaky, unsteady gait. Gait Pattern: Step-to pattern;Narrow base of support;Decreased stance time - left;Decreased stride length Gait velocity: decreased     PT Goals      progressing    Visit Information  Last PT Received On: 04/07/12 Assistance Needed: +1    Subjective Data  Subjective: I feel good   Cognition    good   Balance   poor  End of Session PT - End of  Session Equipment Utilized During Treatment: Gait belt;Left lower extremity prosthesis Activity Tolerance: Patient tolerated treatment well Patient left: in chair;with call bell/phone within reach CPM Left Knee CPM Left Knee: Off   Felecia Shelling  PTA [facility]  Acute  Rehab Pager     [phone]

## 2012-04-07 NOTE — Progress Notes (Signed)
   Subjective: 4 Days Post-Op Procedure(s) (LRB): TOTAL KNEE ARTHROPLASTY (Left) Patient reports pain as mild.   Patient seen in rounds for Dr. Lequita Halt. Patient is hard of hearing. Patient is well, and has had no acute complaints or problems Patient is ready to go SNF today.  Objective: Vital signs in last 24 hours: Temp:  [98.2 F (36.8 C)-100.5 F (38.1 C)] 98.2 F (36.8 C) (08/30 0601) Pulse Rate:  [76-99] 95  (08/30 0601) Resp:  [16-20] 16  (08/30 0601) BP: (96-130)/(45-79) 129/79 mmHg (08/30 0601) SpO2:  [90 %-97 %] 97 % (08/30 0601)  Intake/Output from previous day:  Intake/Output Summary (Last 24 hours) at 04/07/12 0806 Last data filed at 04/07/12 1478  Gross per 24 hour  Intake 1805.42 ml  Output   1875 ml  Net -69.58 ml    Intake/Output this shift:    Labs:  Basename 04/06/12 0440 04/05/12 0420  HGB 7.6* 9.0*    Basename 04/06/12 0440 04/05/12 0420  WBC 6.1 6.8  RBC 2.41* 2.84*  HCT 21.8* 25.6*  PLT 90* 94*    Basename 04/06/12 0440 04/05/12 0420  NA 133* 135  K 3.8 3.4*  CL 101 105  CO2 24 23  BUN 18 22  CREATININE 0.72 0.78  GLUCOSE 119* 113*  CALCIUM 8.1* 8.2*   No results found for this basename: LABPT:2,INR:2 in the last 72 hours  EXAM: General - Patient is Alert, Appropriate and Oriented Extremity - Neurovascular intact Sensation intact distally Dorsiflexion/Plantar flexion intact No cellulitis present Incision - clean, dry, no drainage, healing Motor Function - intact, moving foot and toes well on exam.   Assessment/Plan: 4 Days Post-Op Procedure(s) (LRB): TOTAL KNEE ARTHROPLASTY (Left) Procedure(s) (LRB): TOTAL KNEE ARTHROPLASTY (Left) Past Medical History  Diagnosis Date  . Complication of anesthesia     aspirated during cardioversion  . Peripheral vascular disease     external hemroids  . GI bleeding 2012  . Diverticular disease   . Impaired hearing     both ears  . Dysrhythmia      a- fib history, no current  cardiologist  . Pneumonia oct 2012     gives history of  . Anemia   . Urinary incontinence   . Balance problem     due to knee   Principal Problem:  *OA (osteoarthritis) of knee Active Problems:  Hypokalemia  Acute Blood loss anemia  Postop Transfusion   Discharge to SNF Diet - Regular diet Follow up - in 2 weeks Activity - WBAT Disposition - Skilled nursing facility Condition Upon Discharge - Good D/C Meds - See DC Summary DVT Prophylaxis - Xarelto  [name] 04/07/2012, 8:06 AM

## 2013-07-27 ENCOUNTER — Other Ambulatory Visit: Payer: Self-pay | Admitting: Orthopedic Surgery

## 2013-07-31 ENCOUNTER — Encounter (HOSPITAL_BASED_OUTPATIENT_CLINIC_OR_DEPARTMENT_OTHER): Admission: RE | Payer: Self-pay | Source: Ambulatory Visit

## 2013-07-31 ENCOUNTER — Ambulatory Visit (HOSPITAL_BASED_OUTPATIENT_CLINIC_OR_DEPARTMENT_OTHER): Admission: RE | Admit: 2013-07-31 | Payer: Medicare Other | Source: Ambulatory Visit | Admitting: Orthopedic Surgery

## 2013-07-31 SURGERY — EXCISION MASS
Anesthesia: Choice | Laterality: Right

## 2014-05-09 DIAGNOSIS — E871 Hypo-osmolality and hyponatremia: Secondary | ICD-10-CM

## 2014-05-09 DIAGNOSIS — Z9181 History of falling: Secondary | ICD-10-CM

## 2014-05-09 HISTORY — DX: History of falling: Z91.81

## 2014-05-09 HISTORY — DX: Hypo-osmolality and hyponatremia: E87.1

## 2014-05-14 ENCOUNTER — Inpatient Hospital Stay (HOSPITAL_COMMUNITY): Payer: 59

## 2014-05-14 ENCOUNTER — Inpatient Hospital Stay (HOSPITAL_COMMUNITY)
Admission: EM | Admit: 2014-05-14 | Discharge: 2014-05-16 | DRG: 884 | Disposition: A | Discharge status: Skilled Nursing Facility | Payer: 59 | Attending: Internal Medicine | Admitting: Internal Medicine

## 2014-05-14 ENCOUNTER — Emergency Department (HOSPITAL_COMMUNITY): Payer: 59

## 2014-05-14 ENCOUNTER — Encounter (HOSPITAL_COMMUNITY): Payer: Self-pay | Admitting: Emergency Medicine

## 2014-05-14 DIAGNOSIS — R531 Weakness: Secondary | ICD-10-CM

## 2014-05-14 DIAGNOSIS — Z87891 Personal history of nicotine dependence: Secondary | ICD-10-CM | POA: Diagnosis not present

## 2014-05-14 DIAGNOSIS — E46 Unspecified protein-calorie malnutrition: Secondary | ICD-10-CM | POA: Diagnosis present

## 2014-05-14 DIAGNOSIS — Z96653 Presence of artificial knee joint, bilateral: Secondary | ICD-10-CM | POA: Diagnosis present

## 2014-05-14 DIAGNOSIS — F0391 Unspecified dementia with behavioral disturbance: Secondary | ICD-10-CM | POA: Diagnosis not present

## 2014-05-14 DIAGNOSIS — R627 Adult failure to thrive: Secondary | ICD-10-CM | POA: Diagnosis present

## 2014-05-14 DIAGNOSIS — E039 Hypothyroidism, unspecified: Secondary | ICD-10-CM | POA: Diagnosis present

## 2014-05-14 DIAGNOSIS — Z6821 Body mass index (BMI) 21.0-21.9, adult: Secondary | ICD-10-CM | POA: Diagnosis not present

## 2014-05-14 DIAGNOSIS — D696 Thrombocytopenia, unspecified: Secondary | ICD-10-CM

## 2014-05-14 DIAGNOSIS — R451 Restlessness and agitation: Secondary | ICD-10-CM | POA: Diagnosis present

## 2014-05-14 DIAGNOSIS — E860 Dehydration: Secondary | ICD-10-CM | POA: Diagnosis present

## 2014-05-14 DIAGNOSIS — I480 Paroxysmal atrial fibrillation: Secondary | ICD-10-CM | POA: Diagnosis present

## 2014-05-14 DIAGNOSIS — Z66 Do not resuscitate: Secondary | ICD-10-CM | POA: Diagnosis not present

## 2014-05-14 DIAGNOSIS — R4182 Altered mental status, unspecified: Secondary | ICD-10-CM | POA: Diagnosis present

## 2014-05-14 DIAGNOSIS — H9190 Unspecified hearing loss, unspecified ear: Secondary | ICD-10-CM | POA: Diagnosis present

## 2014-05-14 DIAGNOSIS — E876 Hypokalemia: Secondary | ICD-10-CM | POA: Diagnosis present

## 2014-05-14 DIAGNOSIS — I4891 Unspecified atrial fibrillation: Secondary | ICD-10-CM | POA: Diagnosis present

## 2014-05-14 DIAGNOSIS — R296 Repeated falls: Secondary | ICD-10-CM | POA: Diagnosis present

## 2014-05-14 DIAGNOSIS — H9193 Unspecified hearing loss, bilateral: Secondary | ICD-10-CM | POA: Diagnosis present

## 2014-05-14 DIAGNOSIS — N400 Enlarged prostate without lower urinary tract symptoms: Secondary | ICD-10-CM | POA: Diagnosis present

## 2014-05-14 DIAGNOSIS — F0390 Unspecified dementia without behavioral disturbance: Secondary | ICD-10-CM

## 2014-05-14 HISTORY — DX: Polyneuropathy, unspecified: G62.9

## 2014-05-14 LAB — LACTIC ACID, PLASMA: Lactic Acid, Venous: 2.6 mmol/L — ABNORMAL HIGH (ref 0.5–2.2)

## 2014-05-14 LAB — URINALYSIS, ROUTINE W REFLEX MICROSCOPIC
BILIRUBIN URINE: NEGATIVE
Glucose, UA: NEGATIVE mg/dL
KETONES UR: NEGATIVE mg/dL
Leukocytes, UA: NEGATIVE
NITRITE: NEGATIVE
PROTEIN: NEGATIVE mg/dL
SPECIFIC GRAVITY, URINE: 1.009 (ref 1.005–1.030)
Urobilinogen, UA: 1 mg/dL (ref 0.0–1.0)
pH: 6.5 (ref 5.0–8.0)

## 2014-05-14 LAB — COMPREHENSIVE METABOLIC PANEL
ALBUMIN: 3.4 g/dL — AB (ref 3.5–5.2)
ALT: 36 U/L (ref 0–53)
AST: 92 U/L — ABNORMAL HIGH (ref 0–37)
Alkaline Phosphatase: 108 U/L (ref 39–117)
Anion gap: 14 (ref 5–15)
BUN: 14 mg/dL (ref 6–23)
CALCIUM: 9.5 mg/dL (ref 8.4–10.5)
CO2: 23 meq/L (ref 19–32)
CREATININE: 0.73 mg/dL (ref 0.50–1.35)
Chloride: 94 mEq/L — ABNORMAL LOW (ref 96–112)
GFR calc Af Amer: 90 mL/min — ABNORMAL LOW (ref >90)
GFR, EST NON AFRICAN AMERICAN: 78 mL/min — AB (ref >90)
Glucose, Bld: 99 mg/dL (ref 70–99)
Potassium: 3.9 mEq/L (ref 3.7–5.3)
Sodium: 131 mEq/L — ABNORMAL LOW (ref 137–147)
Total Bilirubin: 2 mg/dL — ABNORMAL HIGH (ref 0.3–1.2)
Total Protein: 6.7 g/dL (ref 6.0–8.3)

## 2014-05-14 LAB — LIPASE, BLOOD: LIPASE: 59 U/L (ref 11–59)

## 2014-05-14 LAB — CBC WITH DIFFERENTIAL/PLATELET
BASOS ABS: 0 10*3/uL (ref 0.0–0.1)
BASOS PCT: 0 % (ref 0–1)
EOS PCT: 1 % (ref 0–5)
Eosinophils Absolute: 0 10*3/uL (ref 0.0–0.7)
HCT: 42.6 % (ref 39.0–52.0)
Hemoglobin: 15.3 g/dL (ref 13.0–17.0)
Lymphocytes Relative: 20 % (ref 12–46)
Lymphs Abs: 1.3 10*3/uL (ref 0.7–4.0)
MCH: 34 pg (ref 26.0–34.0)
MCHC: 35.9 g/dL (ref 30.0–36.0)
MCV: 94.7 fL (ref 78.0–100.0)
MONO ABS: 0.9 10*3/uL (ref 0.1–1.0)
MONOS PCT: 14 % — AB (ref 3–12)
NEUTROS ABS: 4.3 10*3/uL (ref 1.7–7.7)
Neutrophils Relative %: 66 % (ref 43–77)
Platelets: 111 10*3/uL — ABNORMAL LOW (ref 150–400)
RBC: 4.5 MIL/uL (ref 4.22–5.81)
RDW: 13.1 % (ref 11.5–15.5)
WBC: 6.5 10*3/uL (ref 4.0–10.5)

## 2014-05-14 LAB — AMMONIA: Ammonia: 40 umol/L (ref 11–60)

## 2014-05-14 LAB — URINE MICROSCOPIC-ADD ON

## 2014-05-14 LAB — POC OCCULT BLOOD, ED: Fecal Occult Bld: NEGATIVE

## 2014-05-14 LAB — TROPONIN I

## 2014-05-14 MED ORDER — ONDANSETRON HCL 4 MG PO TABS: 4.0000 mg | ORAL_TABLET | Freq: Four times a day (QID) | ORAL | Status: DC | PRN

## 2014-05-14 MED ORDER — ACETAMINOPHEN 650 MG RE SUPP: 650.0000 mg | Freq: Four times a day (QID) | RECTAL | Status: DC | PRN

## 2014-05-14 MED ORDER — ONDANSETRON HCL 4 MG/2ML IJ SOLN: 4.0000 mg | Freq: Four times a day (QID) | INTRAMUSCULAR | Status: DC | PRN

## 2014-05-14 MED ORDER — HALOPERIDOL 1 MG PO TABS
1.0000 mg | ORAL_TABLET | Freq: Two times a day (BID) | ORAL | Status: DC | PRN
  Filled 2014-05-14: qty 1

## 2014-05-14 MED ORDER — SODIUM CHLORIDE 0.9 % IV SOLN
INTRAVENOUS | Status: DC
  Administered 2014-05-14: 75 mL/h via INTRAVENOUS
  Administered 2014-05-15: 08:00:00 via INTRAVENOUS

## 2014-05-14 MED ORDER — ENOXAPARIN SODIUM 30 MG/0.3ML ~~LOC~~ SOLN
30.0000 mg | SUBCUTANEOUS | Status: DC
  Administered 2014-05-14 – 2014-05-15 (×2): 30 mg via SUBCUTANEOUS
  Filled 2014-05-14 (×4): qty 0.3

## 2014-05-14 MED ORDER — ACETAMINOPHEN 325 MG PO TABS
650.0000 mg | ORAL_TABLET | Freq: Four times a day (QID) | ORAL | Status: DC | PRN
  Administered 2014-05-15: 650 mg via ORAL
  Filled 2014-05-14: qty 2

## 2014-05-14 NOTE — H&P (Signed)
[name] is an 80 y.o. male.   PCP:   Precious Reel, MD   Chief Complaint:  Confused, weakness, AFTT, unable to walk, covered in feces.  HPI:  [name] with advanced age, progressive dementia, and recent confusion. Recent starting on Sertraline @ 3 weeks ago which calmed him down.The Zoloft has worked "fantastic."  Less snapping at his wife, more mellow, less edgey, less argumentative.  "not zombie-like."  He is still sleeping alot.   Also started Aricept yesterday Recent increase in sore on hand/ribs sore. More lethargic & confused lately In the past 10 days he has fallen about 7 times.  He has been weak, trouble getting in and out of bed.  "Strength is zero."  Legs don't seem to be working.  Strugglling to get thru the tasks.  "Some mixed signals."  Seems to go in and out.  The patient really tries to compensate verbally.  No n/v/HA/abdominal pain/cough or sob.  No fever.  Appetite comes and goes as well.  Decompensating but not wanting to leave wife or indepent living.   Complains of fatigue, malaise. joint pain.     Pt worsened today and was brought to ED for eval and Treatment.  There has been a gradual onset and worsening of persistent generalized weakness for the past 1 to 2 weeks. Has been associated with frequent falling, increased confusion, decreased PO intake, as well as "dark urine."  No reported syncope, no fevers, no N/V/D.   Na down to 131 - rest of labs unremarkable.   Past Medical History:  Past Medical History  Diagnosis Date  . Complication of anesthesia     aspirated during cardioversion  . Peripheral vascular disease     external hemroids  . GI bleeding 2012  . Diverticular disease   . Impaired hearing     both ears  . Dysrhythmia      a- fib history, no current cardiologist  . Pneumonia oct 2012     gives history of  . Anemia   . Urinary incontinence   . Balance problem     due to knee  . Peripheral neuropathy    hypothyroidism, hyperlipidemia,  scoliosis- short left leg, Mild peripheral neuropathy, hearing loss- wears hearing aids, BPH/LUTS/Nocturia,  GERD April-May 2008 (Afib, pseudomonas bacteremia, sepsis, right lobe empyema-VATS, chest tube, dysphagia)   05/2011 admission for Enterococcal urinary tract infection with acute illness/Pneumonia ruled out./ Fever, leukocytosis, and hypotension resolved./ Altered mental state, resolved./ Failure to thrive, generalized weakness, and fall risk/ PAF now back in sinus rhythm with PACs/PVCs/  Anemia.  Last hemoglobin 10.6 on October 4/.Gait disorder - PT.  06/2011 Admit for LGIB - Diverticular with Colonoscopy 09/2011 LGIB-Diverticular    Past Surgical History  Procedure Laterality Date  . Abcess  2008    had  abcess removed from right  lung  . Hemroidectomy  1940  . Total knee arthroplasty  10-05-2010    rt knee  . Colonoscopy  06/22/2011    Procedure: COLONOSCOPY;  Surgeon: Gatha Mayer, MD;  Location: [facility];  Service: Endoscopy;  Laterality: N/A;  possible egd -if colonoscopy negative  . Colonoascopy      2012  . Joint replacement    . Hernia repair  yrs ago    x 2  . Total knee arthroplasty  04/03/2012    Procedure: TOTAL KNEE ARTHROPLASTY;  Surgeon: Gearlean Alf, MD;  Location: [facility] ORS;  Service: Orthopedics;  Laterality: Left;  . Video assisted  thoracoscopy (vats)/decortication Right 2008      Allergies:   Allergies  Allergen Reactions  . Zolpidem Tartrate     hallucinations     Medications: Prior to Admission medications   Medication Sig Start Date End Date Taking? Authorizing Provider  acetaminophen (TYLENOL) 325 MG tablet Take 650 mg by mouth every 6 (six) hours as needed for mild pain.   Yes Historical Provider, MD  donepezil (ARICEPT) 5 MG tablet Take 5 mg by mouth daily. 05/13/14  Yes Historical Provider, MD  potassium chloride SA (K-DUR,KLOR-CON) 20 MEQ tablet Take 20 mEq by mouth daily. 05/13/14  Yes Historical Provider, MD  sertraline (ZOLOFT) 50  MG tablet Take 50 mg by mouth daily. 04/03/14  Yes Historical Provider, MD      (Not in a hospital admission)   Social History:  reports that he quit smoking about 56 years ago. His smoking use included Cigarettes. He smoked 0.00 packs per day for 15 years. He has never used smokeless tobacco. He reports that he drinks about .6 ounces of alcohol per week. He reports that he does not use illicit drugs.  Family History: Family History  Problem Relation Age of Onset  . Heart failure Sister   . Malignant hyperthermia Brother     Review of Systems:  Review of Systems - See HPI. O/w (-) Pt unable to add much   Physical Exam:  Blood pressure 146/70, pulse 62, resp. rate 17, SpO2 98.00%. Filed Vitals:   05/14/14 1700 05/14/14 1730 05/14/14 1800 05/14/14 1900  BP:  121/65 136/73 146/70  Pulse: 64 72 74 62  Resp:  18 17   SpO2: 98% 97% 97% 98%   General appearance: looks old disheveled, bruised, wrapped. Head: Normocephalic, without obvious abnormality, atraumatic Eyes: conjunctivae/corneas clear. PERRL, EOM's intact.  Nose: Nares normal. Septum midline. Mucosa normal. No drainage or sinus tenderness. OP - Dry Neck: no adenopathy, no carotid bruit, no JVD and thyroid not enlarged, symmetric, no tenderness/mass/nodules Resp: Distant clear Cardio: irreg, m GI: soft, non-tender; bowel sounds normal; no masses,  no organomegaly Extremities: thick, bruised, min Edema Pulses: 2+ and symmetric Lymph nodes: no cervical lymphadenopathy Neurologic: difficult to arouse.  Wakes up answers one question then falls back to sleep.     Labs on Admission:   Recent Labs  05/14/14 1521  NA 131*  K 3.9  CL 94*  CO2 23  GLUCOSE 99  BUN 14  CREATININE 0.73  CALCIUM 9.5    Recent Labs  05/14/14 1521  AST 92*  ALT 36  ALKPHOS 108  BILITOT 2.0*  PROT 6.7  ALBUMIN 3.4*    Recent Labs  05/14/14 1521  LIPASE 59    Recent Labs  05/14/14 1521  WBC 6.5  NEUTROABS 4.3  HGB  15.3  HCT 42.6  MCV 94.7  PLT 111*    Recent Labs  05/14/14 1521  TROPONINI <0.30   Lab Results  Component Value Date   INR 1.13 03/30/2012   INR 1.26 09/28/2011   INR 1.25 06/21/2011     LAB RESULT POCT:  Results for orders placed during the hospital encounter of 05/14/14  URINALYSIS, ROUTINE W REFLEX MICROSCOPIC      Result Value Ref Range   Color, Urine YELLOW  YELLOW   APPearance CLEAR  CLEAR   Specific Gravity, Urine 1.009  1.005 - 1.030   pH 6.5  5.0 - 8.0   Glucose, UA NEGATIVE  NEGATIVE mg/dL   Hgb urine dipstick TRACE (*)  NEGATIVE   Bilirubin Urine NEGATIVE  NEGATIVE   Ketones, ur NEGATIVE  NEGATIVE mg/dL   Protein, ur NEGATIVE  NEGATIVE mg/dL   Urobilinogen, UA 1.0  0.0 - 1.0 mg/dL   Nitrite NEGATIVE  NEGATIVE   Leukocytes, UA NEGATIVE  NEGATIVE  CBC WITH DIFFERENTIAL      Result Value Ref Range   WBC 6.5  4.0 - 10.5 K/uL   RBC 4.50  4.22 - 5.81 MIL/uL   Hemoglobin 15.3  13.0 - 17.0 g/dL   HCT 42.6  39.0 - 52.0 %   MCV 94.7  78.0 - 100.0 fL   MCH 34.0  26.0 - 34.0 pg   MCHC 35.9  30.0 - 36.0 g/dL   RDW 13.1  11.5 - 15.5 %   Platelets 111 (*) 150 - 400 K/uL   Neutrophils Relative % 66  43 - 77 %   Neutro Abs 4.3  1.7 - 7.7 K/uL   Lymphocytes Relative 20  12 - 46 %   Lymphs Abs 1.3  0.7 - 4.0 K/uL   Monocytes Relative 14 (*) 3 - 12 %   Monocytes Absolute 0.9  0.1 - 1.0 K/uL   Eosinophils Relative 1  0 - 5 %   Eosinophils Absolute 0.0  0.0 - 0.7 K/uL   Basophils Relative 0  0 - 1 %   Basophils Absolute 0.0  0.0 - 0.1 K/uL  COMPREHENSIVE METABOLIC PANEL      Result Value Ref Range   Sodium 131 (*) 137 - 147 mEq/L   Potassium 3.9  3.7 - 5.3 mEq/L   Chloride 94 (*) 96 - 112 mEq/L   CO2 23  19 - 32 mEq/L   Glucose, Bld 99  70 - 99 mg/dL   BUN 14  6 - 23 mg/dL   Creatinine, Ser 0.73  0.50 - 1.35 mg/dL   Calcium 9.5  8.4 - 10.5 mg/dL   Total Protein 6.7  6.0 - 8.3 g/dL   Albumin 3.4 (*) 3.5 - 5.2 g/dL   AST 92 (*) 0 - 37 U/L   ALT 36  0 - 53 U/L    Alkaline Phosphatase 108  39 - 117 U/L   Total Bilirubin 2.0 (*) 0.3 - 1.2 mg/dL   GFR calc non Af Amer 78 (*) >90 mL/min   GFR calc Af Amer 90 (*) >90 mL/min   Anion gap 14  5 - 15  LIPASE, BLOOD      Result Value Ref Range   Lipase 59  11 - 59 U/L  TROPONIN I      Result Value Ref Range   Troponin I <0.30  <0.30 ng/mL  LACTIC ACID, PLASMA      Result Value Ref Range   Lactic Acid, Venous 2.6 (*) 0.5 - 2.2 mmol/L  URINE MICROSCOPIC-ADD ON      Result Value Ref Range   Squamous Epithelial / LPF RARE  RARE   WBC, UA 0-2  <3 WBC/hpf   RBC / HPF 3-6  <3 RBC/hpf   Bacteria, UA RARE  RARE  POC OCCULT BLOOD, ED      Result Value Ref Range   Fecal Occult Bld NEGATIVE  NEGATIVE      Radiological Exams on Admission: Dg Abd Acute W/chest  05/14/2014   CLINICAL DATA:  Fall, weakness  EXAM: ACUTE ABDOMEN SERIES (ABDOMEN 2 VIEW & CHEST 1 VIEW)  COMPARISON:  05/12/2011 chest x-ray  FINDINGS: Prominent aortic arch compatible with thoracic aortic aneurysm,  similar to the prior study. Mild cardiac enlargement without heart failure  Streaky lung markings in the bases have progressed likely related to atelectasis. There may be underlying scarring in the lung bases  Lumbar levoscoliosis.  No acute bony abnormality.  Negative for bowel obstruction. No air-fluid level. No free air identified.  IMPRESSION: Bibasilar atelectasis  Thoracic aortic aneurysm  Nonobstructive bowel gas pattern.   Electronically Signed   By: Franchot Gallo M.D.   On: 05/14/2014 16:58      Orders placed during the hospital encounter of 05/14/14  . ED EKG  . ED EKG  . EKG 12-LEAD  . EKG 12-LEAD     Assessment/Plan Active Problems:   Atrial fibrillation   Hypokalemia   Altered mental status   Dementia   AMS - Suspect this is advancing dementia/deconditioning/decline.  May be at end of life.  He looks poor. Need to discuss c family and make DNR Zoloft/Sertraline added 3 weeks ago helped mood and agitation and  aricept added yesterday as a trial Will hold both right now. Hydrate. Haldol prn. Set sleep wake cycle. Ultimately, he needs to be in ALF vrs SNF/LTCF Ammonia level and CCT ordered.  Stooled himself - AAS = Bibasilar atelectasis Thoracic aortic aneurysm Nonobstructive bowel gas pattern.  C Dif and stool studies ordered.  DeH - IVF  Dementia - Agree c Aricept but hold till stools more formed Lethargy and Agitation  Agitation - Sertraline has helped - Haldol prn  Hypokalemia - K 3.2 - started on Kdur yesterday.  #s better today  AFTT/recurrent falls - Will get PT/OT/CW/SW. Walker/Wheelchair. No Driving. Safety. Has supervision some of the time   Hypothyroidism - Check TSH  Hyperlipidemia - not on or needing lipid lowering meds  Mild peripheral neuropathy - NTD  Severe hearing loss- wears hearing aids  BPH/LUTS/Nocturia - NTD  GERD - NTD  H/O Divertic bleed x 2 - Hbg stable  Prior Afib  - monitor and EKG confirm current Afib.  NTD - he is rate controlled and even though at risk of CVA - cannot anticoagulate  Malnutrition. - Nutrition Eval.  DVT Proph.  Thrombocytopenia - NTD   Tests: (1) BASIC METABOLIC PANEL (7867)   GLUCOSE              [H]  121 mg/dl                   60-110   BUN                       12 mg/dl                    5-23   CREATININE                0.8 mg/dl                   0.3-1.5  eGFR Non-African American                             90.2  eGFR African American                             109.2   SODIUM               [L]  134 mEq/L  135-148   POTASSIUM            [L]  3.2 mEq/L                   3.5-5.3   CHLORIDE                  101 mEq/L                   80-111   CO2                       27 mEq/L                    15-35   CALCIUM                   9.3 mg/dL                   7.0-10.5  Tests: (2) CBC (2000)   WBC                       5.40 K/uL                   4.10-10.90   LYM                       1.4 K/uL                     0.6-4.1 ! MID                       0.7 K/uL                    0.0-1.8   GRAN                      3.3 K/uL                    2.0-7.8   LYM%                      26.0 %                      10.0-58.5 ! MID%                      13.4 %                      0.1-24.0   GRAN%                     60.6                        37.0-92.0   RBC                       4.3 M/uL                    4.2-6.3   HGB                       14.8 g/dL                   12.0-18.0   HCT  42.5 %                      37.0-51.0   MCV                  [H]  99.2 fL                     80.0-97.0   MCH                  [H]  34.6 pg                     26.0-32.0   MCHC                      34.8 g/dL                   31.0-36.0   PLT                  [L]  136 K/uL                    140-440   Tests: (1) URINALYSIS (4000)   PH                        6.0                         5.0-9.0   SPECIFIC GRAVITY          1.015                       1.000 - 1.030 ! GLUCOSE                   NEGATIVE                    NEGATIVE   UROBILINOGEN              4.0 mg/dL                   0.2 mg/dL   KETONES                   NEGATIVE mg/dl              NEGATIVE   BLOOD                     TRACE-INTACT                NEGATIVE   NITRATE                   NEGATIVE                    NEGATIVE   LEUKOCYTES                NEGATIVE                    NEGATIVE   PROTEIN                   NEGATIVE mg/dl              NEGATIVE   BILIRUBIN                 NEGATIVE  NEGATIVE  1)  Klor-con M20 20 Meq Oral Cr-tabs (Potassium chloride crys cr) .... Take 1 tablet by mouth daily for two weeks 2)  Aricept 5 Mg Oral Tabs (Donepezil hcl) .... Take 1 tablet by mouth daily at bedtime 3)  Zoloft 50 Mg Oral Tabs (Sertraline hcl) .Marland Kitchen.. 1 po qd 4)  Tylenol 325 Mg Tabs (Acetaminophen) .... As needed 5)  Cod Liver Oil Caps ([place] liver oil caps) .... One capsule by mouth occ 6)  Colace 100 Mg Caps (Docusate  sodium) .... One po daily except if diarrhea    Tanara Turvey M 05/14/2014, 7:11 PM

## 2014-05-14 NOTE — ED Notes (Signed)
Attempted report 

## 2014-05-14 NOTE — ED Notes (Signed)
pts family updated on plan of care  

## 2014-05-14 NOTE — ED Notes (Signed)
Family updated on plan of care  

## 2014-05-14 NOTE — ED Notes (Signed)
[name] pts son at bedside

## 2014-05-14 NOTE — ED Notes (Signed)
[facility] waas seen here yesterday with same. Son states pt is not drinking or eating well, also has dark urine. Pt is normally w/c bound but has been walking lately. Has skin tear to right hand and right forearm per EMS.

## 2014-05-14 NOTE — ED Notes (Signed)
Pt's son Renae Fickleaul: [phone]. Please call if you need anything.

## 2014-05-14 NOTE — ED Notes (Signed)
Patient cannot stand

## 2014-05-14 NOTE — ED Provider Notes (Signed)
CSN: [id number]     Arrival date & time 05/14/14  1434 History   First MD Initiated Contact with Patient 05/14/14 1505     Chief Complaint  Patient presents with  . Fall  . Fatigue  . Altered Mental Status     Patient is a 80 y.o. male presenting with fall and altered mental status. The history is provided by the patient, a relative and a caregiver. The history is limited by the condition of the patient (AMS).  Fall  Altered Mental Status Pt was seen at 1510. Per EMS, pt and pt's family: c/o gradual onset and worsening of persistent generalized weakness for the past 1 to 2 weeks. Has been associated with frequent falling, increased confusion, decreased PO intake, as well as "dark urine." Pt was evaluated by his PMD yesterday for same, and family was told pt did not have a UTI. Family states pt's symptoms continue, so they brought him to the ED for further evaluation. No reported syncope, no fevers, no N/V/D.       Past Medical History  Diagnosis Date  . Complication of anesthesia     aspirated during cardioversion  . Peripheral vascular disease     external hemroids  . GI bleeding 2012  . Diverticular disease   . Impaired hearing     both ears  . Dysrhythmia      a- fib history, no current cardiologist  . Pneumonia oct 2012     gives history of  . Anemia   . Urinary incontinence   . Balance problem     due to knee  . Peripheral neuropathy    Past Surgical History  Procedure Laterality Date  . Abcess  2008    had  abcess removed from right  lung  . Hemroidectomy  1940  . Total knee arthroplasty  10-05-2010    rt knee  . Colonoscopy  06/22/2011    Procedure: COLONOSCOPY;  Surgeon: Iva Boop, MD;  Location: [facility];  Service: Endoscopy;  Laterality: N/A;  possible egd -if colonoscopy negative  . Colonoascopy      2012  . Joint replacement    . Hernia repair  yrs ago    x 2  . Total knee arthroplasty  04/03/2012    Procedure: TOTAL KNEE ARTHROPLASTY;  Surgeon:  Loanne Drilling, MD;  Location: [facility];  Service: Orthopedics;  Laterality: Left;  . Video assisted thoracoscopy (vats)/decortication Right 2008   Family History  Problem Relation Age of Onset  . Heart failure Sister   . Malignant hyperthermia Brother    History  Substance Use Topics  . Smoking status: Former Smoker -- 15 years    Types: Cigarettes    Quit date: 06/19/1957  . Smokeless tobacco: Never Used  . Alcohol Use: 0.6 oz/week    1 Cans of beer per week     Comment: occasional    Review of Systems  Unable to perform ROS: Mental status change      Allergies  Zolpidem tartrate  Home Medications   Prior to Admission medications   Medication Sig Start Date End Date Taking? Authorizing Provider  ARTIFICIAL TEAR OP Place 1 drop into the right eye 2 (two) times daily as needed. Dryness    Historical Provider, MD  rivaroxaban (XARELTO) 10 MG TABS tablet Take 1 tablet (10 mg total) by mouth daily with breakfast. Take Xarelto for two and a half more weeks, then discontinue Xarelto. 04/06/12   Avel Peace, PA-C  BP 127/72  Pulse 67  Resp 21  SpO2 98% Physical Exam 1515: Physical examination:  Nursing notes reviewed; Vital signs and O2 SAT reviewed;  Constitutional: Well developed, Well nourished, In no acute distress; Head:  Normocephalic, atraumatic; Eyes: EOMI, PERRL, No scleral icterus; ENMT: Mouth and pharynx normal, Mucous membranes dry; Neck: Supple, Full range of motion, No lymphadenopathy; Cardiovascular: Irregular irregular rate and rhythm, No gallop; Respiratory: Breath sounds clear & equal bilaterally, No wheezes.  Speaking full sentences with ease, Normal respiratory effort/excursion; Chest: Nontender, Movement normal; Abdomen: Soft, Nontender, Nondistended, Normal bowel sounds. Pt had smear of stool while in ED; soft brown stool was heme neg..; Genitourinary: No CVA tenderness; Extremities: Pulses normal, No deformity, no tenderness, No edema, No calf edema or  asymmetry. +superficial hemostatic skin tear right forearm.; Neuro: Awake, alert, confused re: events. No facial droop. +HOH, otherwise major CN grossly intact.  Speech clear. No gross focal motor or sensory deficits in extremities.; Skin: Color normal, Warm, Dry.    ED Course  Procedures     EKG Interpretation   Date/Time:  Tuesday May 14 2014 15:25:47 EDT Ventricular Rate:  97 PR Interval:    QRS Duration: 102 QT Interval:  393 QTC Calculation: 499 R Axis:   92 Text Interpretation:  Atrial fibrillation Ventricular premature complex  Right axis deviation Prolonged QT Artifact When compared with ECG of  04/06/2012 Atrial fibrillation has replaced Normal sinus rhythm Confirmed  by [facility]  MD, Nicholos Johns [phone]) on 05/14/2014 3:45:52 PM      MDM  MDM Reviewed: previous chart, nursing note and vitals Reviewed previous: labs and ECG Interpretation: labs, ECG and x-ray    Results for orders placed during the hospital encounter of 05/14/14  CBC WITH DIFFERENTIAL      Result Value Ref Range   WBC 6.5  4.0 - 10.5 K/uL   RBC 4.50  4.22 - 5.81 MIL/uL   Hemoglobin 15.3  13.0 - 17.0 g/dL   HCT 60.4  54.0 - 98.1 %   MCV 94.7  78.0 - 100.0 fL   MCH 34.0  26.0 - 34.0 pg   MCHC 35.9  30.0 - 36.0 g/dL   RDW 19.1  47.8 - 29.5 %   Platelets 111 (*) 150 - 400 K/uL   Neutrophils Relative % 66  43 - 77 %   Neutro Abs 4.3  1.7 - 7.7 K/uL   Lymphocytes Relative 20  12 - 46 %   Lymphs Abs 1.3  0.7 - 4.0 K/uL   Monocytes Relative 14 (*) 3 - 12 %   Monocytes Absolute 0.9  0.1 - 1.0 K/uL   Eosinophils Relative 1  0 - 5 %   Eosinophils Absolute 0.0  0.0 - 0.7 K/uL   Basophils Relative 0  0 - 1 %   Basophils Absolute 0.0  0.0 - 0.1 K/uL  COMPREHENSIVE METABOLIC PANEL      Result Value Ref Range   Sodium 131 (*) 137 - 147 mEq/L   Potassium 3.9  3.7 - 5.3 mEq/L   Chloride 94 (*) 96 - 112 mEq/L   CO2 23  19 - 32 mEq/L   Glucose, Bld 99  70 - 99 mg/dL   BUN 14  6 - 23 mg/dL    Creatinine, Ser 6.21  0.50 - 1.35 mg/dL   Calcium 9.5  8.4 - 30.8 mg/dL   Total Protein 6.7  6.0 - 8.3 g/dL   Albumin 3.4 (*) 3.5 - 5.2 g/dL  AST 92 (*) 0 - 37 U/L   ALT 36  0 - 53 U/L   Alkaline Phosphatase 108  39 - 117 U/L   Total Bilirubin 2.0 (*) 0.3 - 1.2 mg/dL   GFR calc non Af Amer 78 (*) >90 mL/min   GFR calc Af Amer 90 (*) >90 mL/min   Anion gap 14  5 - 15  LIPASE, BLOOD      Result Value Ref Range   Lipase 59  11 - 59 U/L  TROPONIN I      Result Value Ref Range   Troponin I <0.30  <0.30 ng/mL  LACTIC ACID, PLASMA      Result Value Ref Range   Lactic Acid, Venous 2.6 (*) 0.5 - 2.2 mmol/L  POC OCCULT BLOOD, ED      Result Value Ref Range   Fecal Occult Bld NEGATIVE  NEGATIVE   Dg Abd Acute W/chest 05/14/2014   CLINICAL DATA:  Fall, weakness  EXAM: ACUTE ABDOMEN SERIES (ABDOMEN 2 VIEW & CHEST 1 VIEW)  COMPARISON:  05/12/2011 chest x-ray  FINDINGS: Prominent aortic arch compatible with thoracic aortic aneurysm, similar to the prior study. Mild cardiac enlargement without heart failure  Streaky lung markings in the bases have progressed likely related to atelectasis. There may be underlying scarring in the lung bases  Lumbar levoscoliosis.  No acute bony abnormality.  Negative for bowel obstruction. No air-fluid level. No free air identified.  IMPRESSION: Bibasilar atelectasis  Thoracic aortic aneurysm  Nonobstructive bowel gas pattern.   Electronically Signed   By: Marlan Palauharles  Clark M.D.   On: 05/14/2014 16:58    1730:  Pt unable to stand for orthostatic VS due to generalized weakness (pt is ambulatory at baseline). Thrombocytopenic per hx. Dx and testing d/w pt's family.  Questions answered.  Verb understanding, agreeable to admit. T/C to Dr. Timothy Lassousso, case discussed, including:  HPI, pertinent PM/SHx, VS/PE, dx testing, ED course and treatment:  Agreeable to admit, requests he will come to the ED for evaluation.   Samuel JesterKathleen Jadea Shiffer, DO 05/15/14 1744

## 2014-05-15 ENCOUNTER — Encounter (HOSPITAL_COMMUNITY): Payer: Self-pay | Admitting: General Practice

## 2014-05-15 LAB — COMPREHENSIVE METABOLIC PANEL
ALT: 30 U/L (ref 0–53)
ANION GAP: 10 (ref 5–15)
AST: 83 U/L — ABNORMAL HIGH (ref 0–37)
Albumin: 2.8 g/dL — ABNORMAL LOW (ref 3.5–5.2)
Alkaline Phosphatase: 88 U/L (ref 39–117)
BUN: 12 mg/dL (ref 6–23)
CALCIUM: 8.5 mg/dL (ref 8.4–10.5)
CO2: 25 mEq/L (ref 19–32)
CREATININE: 0.67 mg/dL (ref 0.50–1.35)
Chloride: 101 mEq/L (ref 96–112)
GFR calc non Af Amer: 80 mL/min — ABNORMAL LOW (ref >90)
Glucose, Bld: 88 mg/dL (ref 70–99)
Potassium: 3.5 mEq/L — ABNORMAL LOW (ref 3.7–5.3)
Sodium: 136 mEq/L — ABNORMAL LOW (ref 137–147)
TOTAL PROTEIN: 5.3 g/dL — AB (ref 6.0–8.3)
Total Bilirubin: 1.9 mg/dL — ABNORMAL HIGH (ref 0.3–1.2)

## 2014-05-15 LAB — VITAMIN B12: VITAMIN B 12: 488 pg/mL (ref 211–911)

## 2014-05-15 LAB — URINE CULTURE

## 2014-05-15 LAB — TSH: TSH: 2.86 u[IU]/mL (ref 0.350–4.500)

## 2014-05-15 LAB — CBC
HCT: 38.7 % — ABNORMAL LOW (ref 39.0–52.0)
HEMOGLOBIN: 13.6 g/dL (ref 13.0–17.0)
MCH: 33 pg (ref 26.0–34.0)
MCHC: 35.1 g/dL (ref 30.0–36.0)
MCV: 93.9 fL (ref 78.0–100.0)
Platelets: 98 10*3/uL — ABNORMAL LOW (ref 150–400)
RBC: 4.12 MIL/uL — ABNORMAL LOW (ref 4.22–5.81)
RDW: 13.3 % (ref 11.5–15.5)
WBC: 4.7 10*3/uL (ref 4.0–10.5)

## 2014-05-15 LAB — MRSA PCR SCREENING: MRSA BY PCR: NEGATIVE

## 2014-05-15 LAB — CLOSTRIDIUM DIFFICILE BY PCR: CDIFFPCR: NEGATIVE

## 2014-05-15 MED ORDER — ENSURE PUDDING PO PUDG
1.0000 | Freq: Three times a day (TID) | ORAL | Status: DC
  Administered 2014-05-15 – 2014-05-16 (×2): 1 via ORAL

## 2014-05-15 MED ORDER — SERTRALINE HCL 25 MG PO TABS
25.0000 mg | ORAL_TABLET | Freq: Every day | ORAL | Status: DC
  Administered 2014-05-15 – 2014-05-16 (×2): 25 mg via ORAL
  Filled 2014-05-15 (×2): qty 1

## 2014-05-15 MED ORDER — POTASSIUM CHLORIDE CRYS ER 20 MEQ PO TBCR
20.0000 meq | EXTENDED_RELEASE_TABLET | Freq: Two times a day (BID) | ORAL | Status: DC
  Administered 2014-05-15 – 2014-05-16 (×3): 20 meq via ORAL
  Filled 2014-05-15 (×4): qty 1

## 2014-05-15 NOTE — Clinical Social Work Psychosocial (Signed)
Clinical Social Work Department BRIEF PSYCHOSOCIAL ASSESSMENT 05/15/2014  Patient:  [name],[name]     Account Number:  [id number]     Admit date:  05/14/2014  Clinical Social Worker:  [name],[name], CLINICAL SOCIAL WORKER  Date/Time:  05/15/2014 02:43 PM  Referred by:  Physician  Date Referred:  05/15/2014 Referred for  SNF Placement   Other Referral:   Interview type:  Family Other interview type:    PSYCHOSOCIAL DATA Living Status:  FACILITY [facility] Level of care:  Independent Living Primary support name:  [name] Primary support relationship to patient:  CHILD, ADULT Degree of support available:   [name]    CURRENT CONCERNS Current Concerns  Post-Acute Placement   Other Concerns:    SOCIAL WORK ASSESSMENT / PLAN CSW spoke with patients son and daughter-in-law concerning SNF placement.  Patients family reports that patient was living in the independent living portion of [facility] with his wife for several years.  Patients family stated that they would like for the patient to be able to stay at [facility] for SNF services. CSW will continue to follow   Assessment/plan status:  Psychosocial Support/Ongoing Assessment of Needs Other assessment/ plan:   FL2   Information/referral to community resources:   [facility] SNF    PATIENT'Hunt/FAMILY'Hunt RESPONSE TO PLAN OF CARE: Patients family is agreeable to SNF placement at [facility] and is hopeful that patient can return to independent living.       [name], LCSWA Clinical Social Worker [phone]

## 2014-05-15 NOTE — Progress Notes (Addendum)
Subjective: Admitted c worsening AFTT, Confusion, weakness, Falls, Fatigue, malaise, joint pains, DeH. He is in Afib c Rate control  Started on IVF He is much more alert this am. He recognized me this am and did not know my name No pains or issues. He lives in [place]. He has poor judgement  Objective: Vital signs in last 24 hours: Temp:  [98.2 F (36.8 C)-98.3 F (36.8 C)] 98.3 F (36.8 C) (10/07 0458) Pulse Rate:  [62-93] 91 (10/07 0458) Resp:  [13-21] 15 (10/07 0458) BP: (112-163)/(62-98) 147/83 mmHg (10/07 0458) SpO2:  [95 %-98 %] 97 % (10/07 0458) Weight:  [68.1 kg (150 lb 2.1 oz)] 68.1 kg (150 lb 2.1 oz) (10/07 0458) Weight change:  Last BM Date: 05/14/14  CBG (last 3)  No results found for this basename: GLUCAP,  in the last 72 hours  Intake/Output from previous day:  Intake/Output Summary (Last 24 hours) at 05/15/14 0930 Last data filed at 05/15/14 0230  Gross per 24 hour  Intake      0 ml  Output    350 ml  Net   -350 ml   10/06 0701 - 10/07 0700 In: -  Out: 350 [Urine:350]   Physical Exam  General appearance: Alert. Eyes: no scleral icterus Throat: oropharynx moist without erythema Resp: CTA Cardio: Irreg Irreg GI: Thin.  soft, non-tender; bowel sounds normal; no masses,  no organomegaly Extremities: no clubbing, cyanosis or edema Bruising   Lab Results:  Recent Labs  05/14/14 1521 05/15/14 0650  NA 131* 136*  K 3.9 3.5*  CL 94* 101  CO2 23 25  GLUCOSE 99 88  BUN 14 12  CREATININE 0.73 0.67  CALCIUM 9.5 8.5     Recent Labs  05/14/14 1521 05/15/14 0650  AST 92* 83*  ALT 36 30  ALKPHOS 108 88  BILITOT 2.0* 1.9*  PROT 6.7 5.3*  ALBUMIN 3.4* 2.8*     Recent Labs  05/14/14 1521 05/15/14 0650  WBC 6.5 4.7  NEUTROABS 4.3  --   HGB 15.3 13.6  HCT 42.6 38.7*  MCV 94.7 93.9  PLT 111* 98*    Lab Results  Component Value Date   INR 1.13 03/30/2012   INR 1.26 09/28/2011   INR 1.25 06/21/2011     Recent Labs   05/14/14 1521  TROPONINI <0.30    No results found for this basename: TSH, T4TOTAL, FREET3, T3FREE, THYROIDAB,  in the last 72 hours  No results found for this basename: VITAMINB12, FOLATE, FERRITIN, TIBC, IRON, RETICCTPCT,  in the last 72 hours  Micro Results: No results found for this or any previous visit (from the past 240 hour(s)).   Studies/Results: Ct Head Wo Contrast  05/14/2014   CLINICAL DATA:  Progressive dementia and worsening recent confusion. Increasing lethargy. Has fallen 7 times in the past 10 days, with diffuse weakness and difficulty getting in and out of bed. Initial encounter.  EXAM: CT HEAD WITHOUT CONTRAST  TECHNIQUE: Contiguous axial images were obtained from the base of the skull through the vertex without intravenous contrast.  COMPARISON:  CT of the head performed 12/24/2006  FINDINGS: There is no evidence of acute infarction, mass lesion, or intra- or extra-axial hemorrhage on CT.  Prominence of the ventricles and sulci reflects moderate age-appropriate cortical volume loss. Cerebellar atrophy is noted. Scattered periventricular white matter change likely reflects small vessel ischemic microangiopathy.  The brainstem and fourth ventricle are within normal limits. The basal ganglia are unremarkable in appearance. The cerebral  hemispheres demonstrate grossly normal gray-white differentiation. No mass effect or midline shift is seen.  There is no evidence of fracture; visualized osseous structures are unremarkable in appearance. The orbits are within normal limits. Mucosal thickening is noted at the maxillary sinuses, more prominent on the left. The remaining paranasal sinuses and mastoid air cells are well-aerated. No significant soft tissue abnormalities are seen.  IMPRESSION: 1. No acute intracranial pathology seen on CT. 2. Moderate age-appropriate cortical volume loss and scattered small vessel ischemic microangiopathy. 3. Mucosal thickening at the maxillary sinuses.    Electronically Signed   By: Roanna RaiderJeffery  Chang M.D.   On: 05/14/2014 23:15   Dg Abd Acute W/chest  05/14/2014   CLINICAL DATA:  Fall, weakness  EXAM: ACUTE ABDOMEN SERIES (ABDOMEN 2 VIEW & CHEST 1 VIEW)  COMPARISON:  05/12/2011 chest x-ray  FINDINGS: Prominent aortic arch compatible with thoracic aortic aneurysm, similar to the prior study. Mild cardiac enlargement without heart failure  Streaky lung markings in the bases have progressed likely related to atelectasis. There may be underlying scarring in the lung bases  Lumbar levoscoliosis.  No acute bony abnormality.  Negative for bowel obstruction. No air-fluid level. No free air identified.  IMPRESSION: Bibasilar atelectasis  Thoracic aortic aneurysm  Nonobstructive bowel gas pattern.   Electronically Signed   By: Marlan Palauharles  Clark M.D.   On: 05/14/2014 16:58     Medications: Scheduled: . enoxaparin (LOVENOX) injection  30 mg Subcutaneous Q24H   Continuous: . sodium chloride 75 mL/hr at 05/15/14 0731     Assessment/Plan: Active Problems:   Atrial fibrillation   Hypokalemia   Altered mental status   Dementia  DeH - IVF.  May be well hydrated by later today and may be able to back off fluids  Stooling - May be from there SSRI and the Aricept - Both on hold at the moment.  AAS = Bibasilar atelectasis Thoracic aortic aneurysm Nonobstructive bowel gas pattern.  C Dif and stool studies P.  Heme (-)  Dementia - Agree c Aricept but hold till stools more formed   AMS c Lethargy and asleep most of day but when awake he has Agitation - Restart Sertraline soon as we can bc it has helped - Haldol prn. CCT = 1. No acute intracranial pathology seen on CT.   2. Moderate age-appropriate cortical volume loss and scattered small vessel ischemic microangiopathy.  3. Mucosal thickening at the maxillary sinuses. Ammonia is only 40 - NTD Labs are unremarkable No UTI  Hypokalemia - K 3.2 - 3.9- Kdur 20 mEq  AFTT/recurrent falls - Will get PT/OT/CW/SW.   Walker/Wheelchair.  No Driving.  Safety.  Has supervision Hypothyroidism - Check TSH  Hyperlipidemia - not on or needing lipid lowering meds  Mild peripheral neuropathy - NTD  Severe hearing loss- wears hearing aids  BPH/LUTS/Nocturia - NTD  GERD - NTD  H/O Divertic bleed x 2 - Hbg stable  Prior Afib - monitor and EKG confirm current Afib. NTD - he is rate controlled and even though at risk of CVA - cannot anticoagulate  Malnutrition. - Nutrition Eval. Albumin and protein levels are poor. DVT Proph.  Thrombocytopenia - NTD   Goal is SNF on Dc to [facility] - May be ready tomorrow - certainly by Friday Discussed c family and he is DNR   LOS: 1 day   Kyleigh Nannini M 05/15/2014, 9:30 AM

## 2014-05-15 NOTE — Clinical Social Work Note (Signed)
CSW was informed by RN that patient is from [facility] independent living facility but will likely need SNF upon discharge. CSW [facility] at [facility] about [facility] to [facility].  Kim reported that they will likely be able to take patient in SNF portion but will need PT notes to assess his level of need.  CSW will continue to follow.  Merlyn LotJenna Holoman, LCSWA Clinical Social Worker [phone]

## 2014-05-15 NOTE — Evaluation (Signed)
Physical Therapy Evaluation Patient Details Name: [name] MRN: [id number] DOB: [birth year] Today's Date: 05/15/2014   History of Present Illness  Pt is a 80 y/o male brought into the ED for eval and treat. There has been a gradual onset and worsening of persistent generalized weakness for the past 1 to 2 weeks. Has been associated with frequent falling, increased confusion, decreased PO intake, as well as "dark urine." In the past 10 days he has fallen about 7 times. He has been weak, trouble getting in and out of bed. "Strength is zero." Legs don't seem to be working. Strugglling to get thru the tasks. "Some mixed signals." Seems to go in and out. Appetite comes and goes as well. Decompensating but not wanting to leave wife or indepent living.   Clinical Impression  Pt admitted with the above. Pt currently with functional limitations due to the deficits listed below (see PT Problem List). At the time of PT eval pt was able to transfer bed>BSC>recliner with mod assist and RW. Communication was difficult due to hearing loss, however pt wears hearing aid in L ear and is responsive to tactile cueing. Pt will benefit from skilled PT to increase their independence and safety with mobility to allow discharge to the venue listed below.       Follow Up Recommendations SNF;Supervision/Assistance - 24 hour    Equipment Recommendations  Rolling walker with 5" wheels (If pt does not already have one)    Recommendations for Other Services       Precautions / Restrictions Precautions Precautions: Fall Restrictions Weight Bearing Restrictions: No      Mobility  Bed Mobility Overal bed mobility: Needs Assistance Bed Mobility: Supine to Sit     Supine to sit: Mod assist     General bed mobility comments: Tactile cueing for sequencing and technique. VC's less effective due to hearing loss. Therapist allowed pt to pull up to sitting from HHA. Support also given posteriorly to elevate trunk  into full sitting position. Bed pad utilized to scoot pt to EOB.   Transfers Overall transfer level: Needs assistance Equipment used: Rolling walker (2 wheeled) Transfers: Sit to/from [employer] CorporationStand;Stand Pivot Transfers Sit to Stand: Mod assist Stand pivot transfers: Mod assist       General transfer comment: Pt transferred bed>BSC with mod assist and therapist face-to-face with pt and gait belt donned. [facility], pt used RW and assist was provided to power-up to full standing position as well as position the walker as pt pivoted around.   Ambulation/Gait             General Gait Details: Deferred for safety - +2 assist not available.   Stairs            Wheelchair Mobility    Modified Rankin (Stroke Patients Only)       Balance Overall balance assessment: Needs assistance Sitting-balance support: Feet supported;Bilateral upper extremity supported Sitting balance-Leahy Scale: Poor Sitting balance - Comments: Pt requires UE support to maintain seated balance.  Postural control: Posterior lean Standing balance support: Bilateral upper extremity supported;During functional activity Standing balance-Leahy Scale: Poor Standing balance comment: Pt requires assist and UE support to maintain standing balance.                             Pertinent Vitals/Pain Pain Assessment: Faces Faces Pain Scale: Hurts even more Pain Location: Pt holding L mid/lower rib area Pain Intervention(s): Monitored  during session;Repositioned    Home Living Family/patient expects to be discharged to:: Skilled nursing facility                      Prior Function Level of Independence: Needs assistance         Comments: Pt from Independent Living where he resides with his wife. In the last 10 days pt has had 7 falls and per chart review, pt is having increased difficulty getting through ADL's. Pt was unable to provide any history at time of eval. Pt with a  significant hearing loss, and even when talking into L side (where hearing aide is), pt still with difficulty hearing therapist.      Hand Dominance   Dominant Hand: Right    Extremity/Trunk Assessment   Upper Extremity Assessment: Defer to OT evaluation           Lower Extremity Assessment: Generalized weakness      Cervical / Trunk Assessment: Kyphotic  Communication   Communication: HOH (Hearing aid L ear)  Cognition Arousal/Alertness: Awake/alert Behavior During Therapy: WFL for tasks assessed/performed Overall Cognitive Status: History of cognitive impairments - at baseline                      General Comments      Exercises        Assessment/Plan    PT Assessment Patient needs continued PT services  PT Diagnosis Difficulty walking;Generalized weakness   PT Problem List Decreased strength;Decreased activity tolerance;Decreased range of motion;Decreased balance;Decreased mobility;Decreased knowledge of use of DME;Decreased safety awareness;Decreased knowledge of precautions  PT Treatment Interventions DME instruction;Gait training;Stair training;Functional mobility training;Therapeutic activities;Therapeutic exercise;Neuromuscular re-education;Patient/family education   PT Goals (Current goals can be found in the Care Plan section) Acute Rehab PT Goals Patient Stated Goal: None stated during session. Per chart review, pt does not want to leave independent living or wife.  PT Goal Formulation: Patient unable to participate in goal setting Time For Goal Achievement: 05/22/14 Potential to Achieve Goals: Fair    Frequency Min 2X/week   Barriers to discharge        Co-evaluation               End of Session Equipment Utilized During Treatment: Gait belt Activity Tolerance: Patient tolerated treatment well Patient left: in chair;with call bell/phone within reach;with nursing/sitter in room Nurse Communication: Mobility status          Time: 1352-1420 PT Time Calculation (min): 28 min   Charges:   PT Evaluation $Initial PT Evaluation Tier I: 1 Procedure PT Treatments $Therapeutic Activity: 23-37 mins   PT G Codes:          Conni Slipper 05/15/2014, 2:38 PM  Conni Slipper, PT, DPT Acute Rehabilitation Services Pager: [phone]

## 2014-05-15 NOTE — Progress Notes (Addendum)
INITIAL NUTRITION ASSESSMENT  DOCUMENTATION CODES Per approved criteria  -Not Applicable   INTERVENTION:  Recommend Speech Path consult for swallow evaluation Ensure Pudding po TID, each supplement provides 170 kcal and 4 grams of protein RD to follow for nutrition care plan  NUTRITION DIAGNOSIS: Increased nutrient needs related to suspected malnutrition as evidenced by estimated nutrition needs  Goal: Pt to meet >/= 90% of their estimated nutrition needs   Monitor:  PO & supplemental intake, weight, labs, I/O's  Reason for Assessment: Consult  80 y.o. male  Admitting Dx: confusion, weakness  ASSESSMENT: 80 y.o. Male with advanced age, progressive dementia, and recent confusion.  Patient sleepy and confused upon RD visit.  Sitter at bedside.  Sitter reported patient ate well at lunch (95%), however, got "choked" when consuming some of his food.  Per wt readings below, patient's weight has been stable (last weight however is from August 2013).  Patient with visible wasting to clavicle area, however, RD unable to complete full Nutrition Focused Physical Exam at this time (patient became somewhat agitated when touched).  RD suspects some level of malnutrition, however, unable to identify at this time.  Height: Ht Readings from Last 1 Encounters:  04/03/12 5' 9.5" (1.765 m)    Weight: Wt Readings from Last 1 Encounters:  05/15/14 150 lb 2.1 oz (68.1 kg)    Ideal Body Weight: 160 lb  % Ideal Body Weight: 94%  Wt Readings from Last 10 Encounters:  05/15/14 150 lb 2.1 oz (68.1 kg)  04/03/12 144 lb (65.318 kg)  04/03/12 144 lb (65.318 kg)  03/30/12 144 lb (65.318 kg)  10/01/11 141 lb 5 oz (64.1 kg)  06/23/11 134 lb 1.6 oz (60.827 kg)  06/23/11 134 lb 1.6 oz (60.827 kg)    Usual Body Weight: 144 lb -- August 2013  % Usual Body Weight: 104%  BMI:  Body mass index is 21.86 kg/(m^2).  Estimated Nutritional Needs: Kcal: 1700-1900 Protein: 80-90 gm Fluid:  1.7-1.9 L  Skin: Intact  Diet Order: General  EDUCATION NEEDS: -No education needs identified at this time   Intake/Output Summary (Last 24 hours) at 05/15/14 1537 Last data filed at 05/15/14 0935  Gross per 24 hour  Intake    360 ml  Output    350 ml  Net     10 ml    Labs:   Recent Labs Lab 05/14/14 1521 05/15/14 0650  NA 131* 136*  K 3.9 3.5*  CL 94* 101  CO2 23 25  BUN 14 12  CREATININE 0.73 0.67  CALCIUM 9.5 8.5  GLUCOSE 99 88    Scheduled Meds: . enoxaparin (LOVENOX) injection  30 mg Subcutaneous Q24H  . potassium chloride  20 mEq Oral BID  . sertraline  25 mg Oral Daily    Continuous Infusions: . sodium chloride 75 mL/hr at 05/15/14 0731    Past Medical History  Diagnosis Date  . Complication of anesthesia     aspirated during cardioversion  . Peripheral vascular disease     external hemroids  . GI bleeding 2012  . Diverticular disease   . Impaired hearing     both ears  . Dysrhythmia      a- fib history, no current cardiologist  . Pneumonia oct 2012     gives history of  . Anemia   . Urinary incontinence   . Balance problem     due to knee  . Peripheral neuropathy   . Dementia     Past  Surgical History  Procedure Laterality Date  . Abcess  2008    had  abcess removed from right  lung  . Hemroidectomy  1940  . Total knee arthroplasty  10-05-2010    rt knee  . Colonoscopy  06/22/2011    Procedure: COLONOSCOPY;  Surgeon: Iva Boop, MD;  Location: [facility];  Service: Endoscopy;  Laterality: N/A;  possible egd -if colonoscopy negative  . Colonoascopy      2012  . Joint replacement    . Hernia repair  yrs ago    x 2  . Total knee arthroplasty  04/03/2012    Procedure: TOTAL KNEE ARTHROPLASTY;  Surgeon: Loanne Drilling, MD;  Location: [facility];  Service: Orthopedics;  Laterality: Left;  . Video assisted thoracoscopy (vats)/decortication Right 2008    Maureen Chatters, RD, LDN Pager #: [phone] After-Hours Pager #:  [phone]

## 2014-05-16 LAB — GI PATHOGEN PANEL BY PCR, STOOL
C difficile toxin A/B: NEGATIVE
CAMPYLOBACTER BY PCR: NEGATIVE
Cryptosporidium by PCR: NEGATIVE
E COLI (ETEC) LT/ST: NEGATIVE
E coli (STEC): NEGATIVE
E coli 0157 by PCR: NEGATIVE
G lamblia by PCR: NEGATIVE
NOROVIRUS G1/G2: NEGATIVE
ROTAVIRUS A BY PCR: NEGATIVE
SALMONELLA BY PCR: NEGATIVE
SHIGELLA BY PCR: NEGATIVE

## 2014-05-16 MED ORDER — SERTRALINE HCL 50 MG PO TABS: 25.0000 mg | ORAL_TABLET | Freq: Every day | ORAL | Status: AC

## 2014-05-16 MED ORDER — DONEPEZIL HCL 5 MG PO TABS: 2.5000 mg | ORAL_TABLET | Freq: Every day | ORAL | Status: AC

## 2014-05-16 MED ORDER — POTASSIUM CHLORIDE CRYS ER 20 MEQ PO TBCR: EXTENDED_RELEASE_TABLET | ORAL | Status: AC

## 2014-05-16 MED ORDER — ENSURE PUDDING PO PUDG: 1.0000 | Freq: Three times a day (TID) | ORAL | Status: AC

## 2014-05-16 NOTE — Evaluation (Signed)
Supervised and reviewed by Aryella Besecker MA CCC-SLP  

## 2014-05-16 NOTE — Progress Notes (Signed)
[facility] [place]

## 2014-05-16 NOTE — Progress Notes (Signed)
OT Cancellation Note  Patient Details Name: Gerald ApaRemus S Hunt MRN: [id number] DOB: 31-Oct-1917   Cancelled Treatment:    Reason Eval/Treat Not Completed: Other (comment) Pt is Medicare and current D/C plan is SNF. No apparent immediate acute care OT needs, therefore will defer OT to SNF. If OT eval is needed please call Acute Rehab Dept. at [phone] or text page OT at [phone].    Evette GeorgesLeonard, Jonnelle Lawniczak Eva [phone] 05/16/2014, 8:00 AM

## 2014-05-16 NOTE — Progress Notes (Signed)
[facility].

## 2014-05-16 NOTE — Progress Notes (Signed)
Subjective: Admitted c worsening AFTT, Confusion, weakness, Falls, Fatigue, malaise, joint pains, DeH. He is in Afib c Rate control  S/P IVF and OK to turn off. again alert this am but in/out of sleep. No pains or issues. He has poor judgement No new issues  Objective: Vital signs in last 24 hours: Temp:  [97.9 F (36.6 C)-98.3 F (36.8 C)] 97.9 F (36.6 C) (10/08 0533) Pulse Rate:  [62-88] 88 (10/08 0533) Resp:  [16-19] 19 (10/08 0533) BP: (147-159)/(90-96) 151/96 mmHg (10/08 0533) SpO2:  [97 %-99 %] 98 % (10/08 0533) Weight:  [67.6 kg (149 lb 0.5 oz)] 67.6 kg (149 lb 0.5 oz) (10/08 0526) Weight change: -0.5 kg (-1 lb 1.6 oz) Last BM Date: 05/15/14  CBG (last 3)  No results found for this basename: GLUCAP,  in the last 72 hours  Intake/Output from previous day:  Intake/Output Summary (Last 24 hours) at 05/16/14 0726 Last data filed at 05/16/14 0630  Gross per 24 hour  Intake 2697.5 ml  Output   1600 ml  Net 1097.5 ml   10/07 0701 - 10/08 0700 In: 2697.5 [P.O.:1020; I.V.:1677.5] Out: 1600 [Urine:1600]   Physical Exam  General appearance: Alert this am but in/out sleep. Eyes: no scleral icterus Throat: oropharynx moist without erythema Resp: CTA Cardio: Irreg Irreg GI: Thin.  soft, non-tender; bowel sounds normal; no masses,  no organomegaly Extremities: no clubbing, cyanosis or edema Bruising   Lab Results:  Recent Labs  05/14/14 1521 05/15/14 0650  NA 131* 136*  K 3.9 3.5*  CL 94* 101  CO2 23 25  GLUCOSE 99 88  BUN 14 12  CREATININE 0.73 0.67  CALCIUM 9.5 8.5     Recent Labs  05/14/14 1521 05/15/14 0650  AST 92* 83*  ALT 36 30  ALKPHOS 108 88  BILITOT 2.0* 1.9*  PROT 6.7 5.3*  ALBUMIN 3.4* 2.8*     Recent Labs  05/14/14 1521 05/15/14 0650  WBC 6.5 4.7  NEUTROABS 4.3  --   HGB 15.3 13.6  HCT 42.6 38.7*  MCV 94.7 93.9  PLT 111* 98*    Lab Results  Component Value Date   INR 1.13 03/30/2012   INR 1.26 09/28/2011   INR 1.25  06/21/2011     Recent Labs  05/14/14 1521  TROPONINI <0.30     Recent Labs  05/15/14 0650  TSH 2.860     Recent Labs  05/15/14 0650  VITAMINB12 488    Micro Results: Recent Results (from the past 240 hour(s))  URINE CULTURE     Status: None   Collection Time    05/14/14  3:22 PM      Result Value Ref Range Status   Specimen Description URINE, CLEAN CATCH   Final   Special Requests NONE   Final   Culture  Setup Time     Final   Value: 05/15/2014 01:47     Performed at [facility]   Colony Count     Final   Value: 50,000 COLONIES/ML     Performed at [facility]   Culture     Final   Value: Multiple bacterial morphotypes present, none predominant. Suggest appropriate recollection if clinically indicated.     Performed at [facility]   Report Status 05/15/2014 FINAL   Final  CLOSTRIDIUM DIFFICILE BY PCR     Status: None   Collection Time    05/15/14  1:38 AM      Result Value Ref  Range Status   C difficile by pcr NEGATIVE  NEGATIVE Final  MRSA PCR SCREENING     Status: None   Collection Time    05/15/14 11:16 AM      Result Value Ref Range Status   MRSA by PCR NEGATIVE  NEGATIVE Final   Comment:            The GeneXpert MRSA Assay (FDA     approved for NASAL specimens     only), is one component of a     comprehensive MRSA colonization     surveillance program. It is not     intended to diagnose MRSA     infection nor to guide or     monitor treatment for     MRSA infections.     Studies/Results: Ct Head Wo Contrast  05/14/2014   CLINICAL DATA:  Progressive dementia and worsening recent confusion. Increasing lethargy. Has fallen 7 times in the past 10 days, with diffuse weakness and difficulty getting in and out of bed. Initial encounter.  EXAM: CT HEAD WITHOUT CONTRAST  TECHNIQUE: Contiguous axial images were obtained from the base of the skull through the vertex without intravenous contrast.  COMPARISON:  CT of the head  performed 12/24/2006  FINDINGS: There is no evidence of acute infarction, mass lesion, or intra- or extra-axial hemorrhage on CT.  Prominence of the ventricles and sulci reflects moderate age-appropriate cortical volume loss. Cerebellar atrophy is noted. Scattered periventricular white matter change likely reflects small vessel ischemic microangiopathy.  The brainstem and fourth ventricle are within normal limits. The basal ganglia are unremarkable in appearance. The cerebral hemispheres demonstrate grossly normal gray-white differentiation. No mass effect or midline shift is seen.  There is no evidence of fracture; visualized osseous structures are unremarkable in appearance. The orbits are within normal limits. Mucosal thickening is noted at the maxillary sinuses, more prominent on the left. The remaining paranasal sinuses and mastoid air cells are well-aerated. No significant soft tissue abnormalities are seen.  IMPRESSION: 1. No acute intracranial pathology seen on CT. 2. Moderate age-appropriate cortical volume loss and scattered small vessel ischemic microangiopathy. 3. Mucosal thickening at the maxillary sinuses.   Electronically Signed   By: Roanna Raider M.D.   On: 05/14/2014 23:15   Dg Abd Acute W/chest  05/14/2014   CLINICAL DATA:  Fall, weakness  EXAM: ACUTE ABDOMEN SERIES (ABDOMEN 2 VIEW & CHEST 1 VIEW)  COMPARISON:  05/12/2011 chest x-ray  FINDINGS: Prominent aortic arch compatible with thoracic aortic aneurysm, similar to the prior study. Mild cardiac enlargement without heart failure  Streaky lung markings in the bases have progressed likely related to atelectasis. There may be underlying scarring in the lung bases  Lumbar levoscoliosis.  No acute bony abnormality.  Negative for bowel obstruction. No air-fluid level. No free air identified.  IMPRESSION: Bibasilar atelectasis  Thoracic aortic aneurysm  Nonobstructive bowel gas pattern.   Electronically Signed   By: Marlan Palau M.D.   On:  05/14/2014 16:58     Medications: Scheduled: . enoxaparin (LOVENOX) injection  30 mg Subcutaneous Q24H  . feeding supplement (ENSURE)  1 Container Oral TID BM  . potassium chloride  20 mEq Oral BID  . sertraline  25 mg Oral Daily   Continuous:     Assessment/Plan: Active Problems:   Atrial fibrillation   Hypokalemia   Altered mental status   Dementia  DeH - S/P IVF.  He currently is well hydrated. Hep lock IV.  Stooling -  May be from there SSRI and the Aricept - Doing better and Both will be restarted.  His AAS = Bibasilar atelectasis Thoracic aortic aneurysm Nonobstructive bowel gas pattern.  C Dif (-) and stool studies P but seems unnecessary as he is fine.  Heme (-)  Dementia - Agree c Aricept - Start back at 2.5 mg HS  AMS c Lethargy - When he was [place] and [place] the complaint was that he was asleep most of day but when awake he had Agitation - The Sertraline helped him be less snappy and irritable. Sertraline restarted. Did not need Haldol prn. CCT = 1. No acute intracranial pathology seen on CT.   2. Moderate age-appropriate cortical volume loss and scattered small vessel ischemic microangiopathy.  3. Mucosal thickening at the maxillary sinuses. Ammonia = 40 - NTD Labs are unremarkable No UTI W/up unremarkable. Haldol D/ced as he did not need or get the medicine.  Hypokalemia - K 3.2 - 3.9- Kdur 20 mEq for next 2 weeks then recheck BMET and re-eval  AFTT/recurrent falls - PT/OT/CW/SW consults were initiated Walker/Wheelchair as needed.Marland Kitchen  No Driving.  Safety.  Has supervision at [place] [facility] saw pt 10/7 for worsening of persistent generalized weakness for the past 1 to 2 weeks. Has been associated with frequent falling, increased confusion, decreased PO intake, as well as "dark urine." In the past 10 days he has fallen about 7 times. He has been weak, trouble getting in and out of bed. "Strength is zero." Legs don't seem to be working. Strugglling to get thru the  tasks. "Some mixed signals." Seems to go in and out. Appetite comes and goes as well. Decompensating but not wanting to leave wife or indepent living. They recommended SNF; Supervision/Assistance - 24 hour and Rolling walker with 5" wheels (If pt does not already have one).  I had long discussion with pt and family.  Goal with Mr Langlinais is to increase function and QOL.  This is best served c Short term rehab.  He is clearly wearing out due to age.  CSW saw pt 10/7 and stated - Patients family is agreeable to SNF placement at [facility] and is hopeful that patient can eventually return to independent living.  RD saw him for protein Calorie malnutrition as albumin 2.8 and protein 5.3 and they recommended 1. Speech Path consult for swallow evaluation.  2. Ensure Pudding po TID, each supplement provides 170 kcal and 4 grams of protein   Will get Swallow study today for eval and treat but would like to see if we can do everything to get him back to [facility] today?   AST 83 from ? Etiology.  No w/up pursued at this time. Hypothyroidism - TSH 2.860 and not needing meds Hyperlipidemia - not on or needing lipid lowering meds  Mild peripheral neuropathy - NTD.  B12 = 488 Severe hearing loss- wears hearing aids but barely helps BPH/LUTS/Nocturia - NTD  GERD - NTD  H/O Divertic bleed x 2 - Hbg stable  Prior Afib - monitor and EKG confirm currently in Afib. NTD - he is rate controlled and even though at risk of CVA - cannot anticoagulate.  Not a pacer candidate.  He and family know he is at risk of CVA.   Malnutrition. - Nutrition Eval. Albumin and protein levels are poor. DVT Proph provided in house.  Thrombocytopenia as low as 98 - NTD   Goal is SNF on Dc to [facility] - either today or tomorrow.  i  went to sign FL-2 but it was not in shadow chart.  However I signed his OOF DNR.  Await word from [facility] when we can D/c him. Continue DNR   LOS: 2 days   Kailei Cowens M 05/16/2014, 7:26 AM

## 2014-05-16 NOTE — Evaluation (Signed)
Clinical/Bedside Swallow Evaluation Patient Details  Name: [name] MRN: [id number] Date of Birth: Feb 07, 1918  Today's Date: 05/16/2014 Time: 1355-1415 SLP Time Calculation (min): 20 min  Past Medical History:  Past Medical History  Diagnosis Date  . Complication of anesthesia     aspirated during cardioversion  . Peripheral vascular disease     external hemroids  . GI bleeding 2012  . Diverticular disease   . Impaired hearing     both ears  . Dysrhythmia      a- fib history, no current cardiologist  . Pneumonia oct 2012     gives history of  . Anemia   . Urinary incontinence   . Balance problem     due to knee  . Peripheral neuropathy   . Dementia    Past Surgical History:  Past Surgical History  Procedure Laterality Date  . Abcess  2008    had  abcess removed from right  lung  . Hemroidectomy  1940  . Total knee arthroplasty  10-05-2010    rt knee  . Colonoscopy  06/22/2011    Procedure: COLONOSCOPY;  Surgeon: Iva Boop, MD;  Location: [facility];  Service: Endoscopy;  Laterality: N/A;  possible egd -if colonoscopy negative  . Colonoascopy      2012  . Joint replacement    . Hernia repair  yrs ago    x 2  . Total knee arthroplasty  04/03/2012    Procedure: TOTAL KNEE ARTHROPLASTY;  Surgeon: Loanne Drilling, MD;  Location: [facility];  Service: Orthopedics;  Laterality: Left;  . Video assisted thoracoscopy (vats)/decortication Right 2008   HPI:  Pt is a 80 y/o admitted with gradual onset and worsening of persistent generalized weakness for the past 1 to 2 weeks, FTT. Pt has advanced dementia. Has been associated with frequent falling, increased confusion, decreased PO intake, as well as "dark urine." Pt has a history of moderate dysphagia per 2008 MBS with penetration of thin and thick liquids and residuals, recommended to complete quick multiple swallows.    Assessment / Plan / Recommendation Clinical Impression   Pt demonstrates swallow function within  normal limits. He requires set-up, but is able to self feed once started. Although he demonstrates no signs or symptoms of aspiration, he is noted to be impulsive, taking consecutive large sips of liquids and oversized bites of solids. Recommend full supervision for cueing for compensatory strategies (small single bites and sips, follow solids with liquids), regular diet with thin liquids. No further speech services are warranted at this time. SLP signing off.    Aspiration Risk  Mild    Diet Recommendation Regular;Thin liquid   Liquid Administration via: Straw;Cup Medication Administration: Whole meds with liquid Supervision: Patient able to self feed;Full supervision/cueing for compensatory strategies Compensations: Slow rate;Small sips/bites Postural Changes and/or Swallow Maneuvers: Seated upright 90 degrees    Other  Recommendations Oral Care Recommendations: Oral care BID   Follow Up Recommendations  Skilled Nursing facility    Frequency and Duration        Pertinent Vitals/Pain FACES,6    SLP Swallow Goals     Swallow Study Prior Functional Status       General Date of Onset: 05/14/14 HPI: Pt is a 80 y/o admitted with gradual onset and worsening of persistent generalized weakness for the past 1 to 2 weeks, FTT. Pt has advanced dementia. Has been associated with frequent falling, increased confusion, decreased PO intake, as well as "dark urine." Pt  has a history of moderate dysphagia per 2008 MBS with penetration of thin and thick liquids and residuals, recommended to complete quick multiple swallows.  Type of Study: Bedside swallow evaluation Previous Swallow Assessment:  (yes, 2008) Diet Prior to this Study: Regular;Thin liquids Temperature Spikes Noted: No Respiratory Status: Room air Behavior/Cognition: Alert;Agitated;Requires cueing;Hard of hearing Oral Cavity - Dentition: Adequate natural dentition Self-Feeding Abilities: Able to feed self;Needs set up Patient  Positioning: Upright in bed Baseline Vocal Quality: Clear    Oral/Motor/Sensory Function Overall Oral Motor/Sensory Function: Appears within functional limits for tasks assessed   Ice Chips     Thin Liquid Thin Liquid: Within functional limits Presentation: Straw;Self Fed    Nectar Thick     Honey Thick     Puree Puree: Within functional limits Presentation: Spoon;Self Fed   Solid   GO    Solid: Within functional limits Presentation: Spoon;Self Fed       Barkley Bruns'Brien, Katherine 05/16/2014,2:20 PM

## 2014-05-16 NOTE — Clinical Social Work Note (Signed)
Patient will discharge to [facility] Anticipated discharge date: 05/16/14 Family notified: [facility] [employer]  CSW signing off.  Merlyn LotJenna Holoman, LCSWA Clinical Social Worker [phone]

## 2014-05-16 NOTE — Discharge Summary (Signed)
Physician Discharge Summary  DISCHARGE SUMMARY   Patient ID: [name] MR#: [id number] DOB/AGE: 1917-11-08 80 y.o.   Attending Physician:Amani Marseille M  Patient's WUJ:WJXBJ,YNWG M, MD  Consults: **  Admit date: 05/14/2014 Discharge date: 05/16/2014  Discharge Diagnoses:  Active Problems:   Atrial fibrillation   Hypokalemia   Altered mental status   Dementia   Patient Active Problem List   Diagnosis Date Noted  . Altered mental status 05/14/2014  . Dementia 05/14/2014  . Postop Transfusion 04/06/2012  . Acute Blood loss anemia 04/04/2012  . OA (osteoarthritis) of knee 04/03/2012  . Lower gastrointestinal bleed 06/22/2011  . Anemia 06/22/2011  . Hypokalemia 06/22/2011  . Atrial fibrillation 10/26/2007  . INGUINAL HERNIA 10/26/2007  . OSTEOARTHRITIS, GENERALIZED 10/26/2007   Past Medical History  Diagnosis Date  . Complication of anesthesia     aspirated during cardioversion  . Peripheral vascular disease     external hemroids  . GI bleeding 2012  . Diverticular disease   . Impaired hearing     both ears  . Dysrhythmia      a- fib history, no current cardiologist  . Pneumonia oct 2012     gives history of  . Anemia   . Urinary incontinence   . Balance problem     due to knee  . Peripheral neuropathy   . Dementia     Discharged Condition: fair   Discharge Medications:   Medication List         acetaminophen 325 MG tablet  Commonly known as:  TYLENOL  Take 650 mg by mouth every 6 (six) hours as needed for mild pain.     donepezil 5 MG tablet  Commonly known as:  ARICEPT  Take 0.5 tablets (2.5 mg total) by mouth at bedtime.     feeding supplement (ENSURE) Pudg  Take 1 Container by mouth 3 (three) times daily between meals.     potassium chloride SA 20 MEQ tablet  Commonly known as:  K-DUR,KLOR-CON  Take 1 tablet (20 mEq total) by mouth daily for 2 weeks.     sertraline 50 MG tablet  Commonly known as:  ZOLOFT  Take 0.5 tablets (25 mg  total) by mouth daily.        Hospital Procedures: Ct Head Wo Contrast  05/14/2014   CLINICAL DATA:  Progressive dementia and worsening recent confusion. Increasing lethargy. Has fallen 7 times in the past 10 days, with diffuse weakness and difficulty getting in and out of bed. Initial encounter.  EXAM: CT HEAD WITHOUT CONTRAST  TECHNIQUE: Contiguous axial images were obtained from the base of the skull through the vertex without intravenous contrast.  COMPARISON:  CT of the head performed 12/24/2006  FINDINGS: There is no evidence of acute infarction, mass lesion, or intra- or extra-axial hemorrhage on CT.  Prominence of the ventricles and sulci reflects moderate age-appropriate cortical volume loss. Cerebellar atrophy is noted. Scattered periventricular white matter change likely reflects small vessel ischemic microangiopathy.  The brainstem and fourth ventricle are within normal limits. The basal ganglia are unremarkable in appearance. The cerebral hemispheres demonstrate grossly normal gray-white differentiation. No mass effect or midline shift is seen.  There is no evidence of fracture; visualized osseous structures are unremarkable in appearance. The orbits are within normal limits. Mucosal thickening is noted at the maxillary sinuses, more prominent on the left. The remaining paranasal sinuses and mastoid air cells are well-aerated. No significant soft tissue abnormalities are seen.  IMPRESSION: 1. No acute intracranial  pathology seen on CT. 2. Moderate age-appropriate cortical volume loss and scattered small vessel ischemic microangiopathy. 3. Mucosal thickening at the maxillary sinuses.   Electronically Signed   By: Roanna Raider M.D.   On: 05/14/2014 23:15   Dg Abd Acute W/chest  05/14/2014   CLINICAL DATA:  Fall, weakness  EXAM: ACUTE ABDOMEN SERIES (ABDOMEN 2 VIEW & CHEST 1 VIEW)  COMPARISON:  05/12/2011 chest x-ray  FINDINGS: Prominent aortic arch compatible with thoracic aortic aneurysm,  similar to the prior study. Mild cardiac enlargement without heart failure  Streaky lung markings in the bases have progressed likely related to atelectasis. There may be underlying scarring in the lung bases  Lumbar levoscoliosis.  No acute bony abnormality.  Negative for bowel obstruction. No air-fluid level. No free air identified.  IMPRESSION: Bibasilar atelectasis  Thoracic aortic aneurysm  Nonobstructive bowel gas pattern.   Electronically Signed   By: Marlan Palau M.D.   On: 05/14/2014 16:58    History of Present Illness:  47 M with advanced age, progressive dementia, and recent confusion presented to ED and was admitted for worsening AFTT, Confusion, weakness, Falls, Fatigue, malaise, lethargy, joint pains, DeH.  Recently started on Sertraline @ 3 weeks ago which calmed him down a bit.The Zoloft has worked "fantastic." Less snapping at his wife, more mellow, less edgey, less argumentative. "not zombie-like." Still sleeping alot.  Started Aricept 1 day prior to admit.  In the 10 days prior to admit he had fallen about 7 times. He has been weak, trouble getting in and out of bed. "Strength is zero." Legs don't seem to be working. Strugglling to get through the tasks. "Some mixed signals." Seems to go in and out. The patient really tries to compensate verbally.  Denied n/v/HA/abdominal pain/cough or sob. No fever.  Appetite comes and goes as well. Decompensating but not wanting to leave wife or indepent living.   Hospital Course: Admitted c worsening AFTT, Confusion, weakness, Falls, Fatigue, malaise, joint pains, DeH.  He was found to be in Afib c Rate control  S/P IVF and OK to turn off on am 10/8.  He has been alert each morning I have seen him.  He recognized me but not my name.  Denies pains or issues.  He has poor judgement    DeH - S/P IVF. He is well hydrated.   Stooling - May be from there SSRI and the Aricept - Doing better and Both restarted. His AAS = Bibasilar atelectasis  Thoracic aortic aneurysm Nonobstructive bowel gas pattern.  C Dif (-) and stool studies P but seems unnecessary as he is fine and no further diarrhea/GI issues.  Heme (-)   Dementia - Agree c Aricept - Start back at 2.5 mg HS   AMS c Lethargy - When he was home c wife at [facility] [place] with some caregivers he was asleep most of day but when awake he had Agitation - The Sertraline helped him be less snappy and irritable. Sertraline restarted here in the hospital and can continue at [facility]. Did not need Haldol prn. Had sitter for first few days. CCT = 1. No acute intracranial pathology seen on CT. 2. Moderate age-appropriate cortical volume loss and scattered small vessel ischemic microangiopathy. 3. Mucosal thickening at the maxillary sinuses.  Ammonia = 40 - NTD  Labs are unremarkable  No UTI  W/up unremarkable.  Haldol D/ced as he did not need or get the medicine.   Hypokalemia - K 3.2 - 3.9- Kdur 20  mEq for next 2 weeks then recheck BMET and re-eval   AFTT/recurrent falls - PT/OT/CW/SW consults were initiated  Walker/Wheelchair as needed.Marland Kitchen  No Driving.  Safety.  [place] supervision [place]  Physical Therapy saw pt 10/7 for worsening of persistent generalized weakness for the past 1 to 2 weeks. Has been associated with frequent falling, increased confusion, decreased PO intake, as well as "dark urine." In the past 10 days he has fallen about 7 times. He has been weak, trouble getting in and out of bed. "Strength is zero." Legs don't seem to be working. Strugglling to get thru the tasks. "Some mixed signals." Seems to go in and out. Appetite comes and goes as well. Decompensating but not wanting to leave wife or indepent living. They recommended SNF; Supervision/Assistance - 24 hour and Rolling walker with 5" wheels (If pt does not already have one). I had long discussion with pt and family. Goal with Mr Kirchgessner is to increase function and QOL. This is best served c Short term rehab. He is clearly wearing  out due to age. CSW saw pt 10/7 and stated - Patients family is agreeable to SNF placement at [facility] and is hopeful that patient can eventually return to independent living. RD saw him for protein Calorie malnutrition as albumin 2.8 and protein 5.3 and they recommended 1. Speech Path consult for swallow evaluation. 2. Ensure Pudding po TID, each supplement provides 170 kcal and 4 grams of protein   ST eval and treat  ST saw him 10/8. Mild Asp Risk  Thin Liquid: Within functional limits  Oral care BID  OK for Puree and solids.    AST 83 from ? Etiology. No w/up pursued at this time.   Hypothyroidism - TSH 2.860 and not needing meds  Hyperlipidemia - not on or needing lipid lowering meds  Mild peripheral neuropathy - NTD. B12 = 488  Severe hearing loss- wears hearing aids but barely helps  BPH/LUTS/Nocturia - NTD  GERD - NTD  H/O Divertic bleed x 2 - Hbg stable  Prior Afib - monitor and EKG confirm currently in Afib. NTD - he is rate controlled and even though at risk of CVA - cannot anticoagulate. Not a pacer candidate. He and family know he is at risk of CVA.  Malnutrition. - Nutrition Eval. Albumin and protein levels are poor.  DVT Proph provided in house.  Thrombocytopenia as low as 98 - NTD   Goal is SNF on Dc to [facility]   I signed his OOF DNR.  Continue DNR    Day of Discharge Exam BP 151/96  Pulse 88  Temp(Src) 97.9 F (36.6 C) (Oral)  Resp 19  Wt 67.6 kg (149 lb 0.5 oz)  SpO2 98%  Physical Exam:  See PN from this am.   Discharge Labs:  Recent Labs  05/14/14 1521 05/15/14 0650  NA 131* 136*  K 3.9 3.5*  CL 94* 101  CO2 23 25  GLUCOSE 99 88  BUN 14 12  CREATININE 0.73 0.67  CALCIUM 9.5 8.5    Recent Labs  05/14/14 1521 05/15/14 0650  AST 92* 83*  ALT 36 30  ALKPHOS 108 88  BILITOT 2.0* 1.9*  PROT 6.7 5.3*  ALBUMIN 3.4* 2.8*    Recent Labs  05/14/14 1521 05/15/14 0650  WBC 6.5 4.7  NEUTROABS 4.3  --   HGB 15.3 13.6  HCT 42.6  38.7*  MCV 94.7 93.9  PLT 111* 98*    Recent Labs  05/14/14 1521  TROPONINI <0.30    Recent Labs  05/15/14 0650  TSH 2.860    Recent Labs  05/15/14 0650  VITAMINB12 488   Lab Results  Component Value Date   INR 1.13 03/30/2012   INR 1.26 09/28/2011   INR 1.25 06/21/2011       Discharge instructions:  03-Skilled Nursing Facility    Disposition: SNF  Follow-up Appts: [facility] D/c from SNF.  Call for appointment.  Condition on Discharge: stable  Tests Needing Follow-up: None.  Time spent in discharge (includes decision making & examination of pt): 40 min  Signed: Karmen Altamirano M 05/16/2014, 7:46 AM

## 2014-05-17 ENCOUNTER — Non-Acute Institutional Stay (SKILLED_NURSING_FACILITY): Payer: Medicare Other | Admitting: Internal Medicine

## 2014-05-17 DIAGNOSIS — F0280 Dementia in other diseases classified elsewhere without behavioral disturbance: Secondary | ICD-10-CM

## 2014-05-17 DIAGNOSIS — Z9181 History of falling: Secondary | ICD-10-CM

## 2014-05-17 DIAGNOSIS — E46 Unspecified protein-calorie malnutrition: Secondary | ICD-10-CM

## 2014-05-17 DIAGNOSIS — G309 Alzheimer's disease, unspecified: Secondary | ICD-10-CM

## 2014-05-17 DIAGNOSIS — E871 Hypo-osmolality and hyponatremia: Secondary | ICD-10-CM

## 2014-05-17 DIAGNOSIS — E876 Hypokalemia: Secondary | ICD-10-CM

## 2014-05-17 DIAGNOSIS — R32 Unspecified urinary incontinence: Secondary | ICD-10-CM

## 2014-05-17 DIAGNOSIS — N400 Enlarged prostate without lower urinary tract symptoms: Secondary | ICD-10-CM

## 2014-05-17 DIAGNOSIS — D696 Thrombocytopenia, unspecified: Secondary | ICD-10-CM

## 2014-05-17 DIAGNOSIS — R410 Disorientation, unspecified: Secondary | ICD-10-CM

## 2014-05-18 ENCOUNTER — Encounter: Payer: Self-pay | Admitting: Internal Medicine

## 2014-05-18 DIAGNOSIS — D696 Thrombocytopenia, unspecified: Secondary | ICD-10-CM

## 2014-05-18 DIAGNOSIS — F0280 Dementia in other diseases classified elsewhere without behavioral disturbance: Secondary | ICD-10-CM | POA: Insufficient documentation

## 2014-05-18 DIAGNOSIS — E46 Unspecified protein-calorie malnutrition: Secondary | ICD-10-CM | POA: Insufficient documentation

## 2014-05-18 DIAGNOSIS — G309 Alzheimer's disease, unspecified: Principal | ICD-10-CM

## 2014-05-18 DIAGNOSIS — N400 Enlarged prostate without lower urinary tract symptoms: Secondary | ICD-10-CM | POA: Insufficient documentation

## 2014-05-18 DIAGNOSIS — R32 Unspecified urinary incontinence: Secondary | ICD-10-CM | POA: Insufficient documentation

## 2014-05-18 HISTORY — DX: Thrombocytopenia, unspecified: D69.6

## 2014-05-18 NOTE — Progress Notes (Addendum)
Patient ID: [name], male   DOB: Jul 05, 1918, 80 y.o.   MRN: [id number]    HISTORY AND PHYSICAL  Location:  [facility] Room Number: 546 Place of Service: [id number]   Extended Emergency Contact Information Primary Emergency Contact: Tobie Lords [place] Phone: [phone] Mobile Phone: [phone] Relation: Son Secondary Emergency Contact: Lalla Brothers [place] Phone: [phone] Mobile Phone: [phone] Relation: Relative   Chief Complaint  Patient presents with  . New Admit To SNF    HPI:  80year-old male, patient of Dr. Shon Baton, was admitted to the hospital 05/14/14 through 05/16/14. She was found down and on presentation to the emergency room he was covered in feces. He has no problems of dementia and was started on Aricept the week prior to this hospitalization. He has also had behavioral changes with increased confusion and anxiety. Recently was put on Zoloft which did seem to help this problem.  On my initial exam today he seems to areas. He is quite fidgety and is difficult to get him to focus on anything. He repeatedly says "help me".  While hospitalized he had evidence of mild height of the trainee and was sodium of 131 and hypokalemia. These were treated during the hospital stay and returned to normal.  Brain scan showed findings of cerebrovascular small vessel disease and cerebellar atrophy.  He has not unstable gait. Her problems also of peripheral neuropathy, urinary incontinence, BPH, GERD, transient hyperglycemia 221 mg percent, cardiomegaly, thoracic aortic aneurysm, and severe loss of hearing which impairs communication.  Past Medical History  Diagnosis Date  . Complication of anesthesia     aspirated during cardioversion  . Peripheral vascular disease     external hemroids  . GI bleeding 2012  . Diverticular disease   . Impaired hearing     both ears  .  Dysrhythmia      a- fib history, no current cardiologist  . Pneumonia oct 2012     gives history of  . Anemia   . Urinary incontinence   . Balance problem     due to knee  . Peripheral neuropathy   . Alzheimer's disease   . History of fall October 2015  . Hyponatremia October 2015  . Atrial fibrillation   . BPH (benign prostatic hyperplasia)   . GERD (gastroesophageal reflux disease)   . Cerebellar atrophy   . Cerebrovascular disease   . Malnutrition   . Lumbar scoliosis   . Hyperlipidemia   . Cardiomegaly   . Thoracic aortic aneurysm   . Hypokalemia   . OA (osteoarthritis) of knee 04/03/2012  . OSTEOARTHRITIS, GENERALIZED 10/26/2007    Qualifier: Diagnosis of  By: Marland Mcalpine      Past Surgical History  Procedure Laterality Date  . Abcess  2008    had  abcess removed from right  lung  . Hemroidectomy  1940  . Total knee arthroplasty  10-05-2010    rt knee  . Colonoscopy  06/22/2011    Procedure: COLONOSCOPY;  Surgeon: Gatha Mayer, MD;  Location: [facility];  Service: Endoscopy;  Laterality: N/A;  possible egd -if colonoscopy negative  . Colonoascopy      2012  . Joint replacement    . Hernia repair  yrs ago    x 2  . Total knee arthroplasty  04/03/2012    Procedure: TOTAL KNEE ARTHROPLASTY;  Surgeon: Gearlean Alf, MD;  Location: [facility];  Service: Orthopedics;  Laterality: Left;  . Video assisted thoracoscopy (vats)/decortication Right 2008    History   Social History  . Marital Status: Married    Spouse Name: N/A    Number of Children: N/A  . Years of Education: N/A   Occupational History  . Not on file.   Social History Main Topics  . Smoking status: Former Smoker -- 15 years    Types: Cigarettes    Quit date: 06/19/1957  . Smokeless tobacco: Never Used  . Alcohol Use: 0.6 oz/week    1 Cans of beer per week     Comment: occasional  . Drug Use: No  . Sexual Activity: Not Currently   Other Topics Concern  . Not on file   Social  History Narrative  . No narrative on file     reports that he quit smoking about 56 years ago. His smoking use included Cigarettes. He smoked 0.00 packs per day for 15 years. He has never used smokeless tobacco. He reports that he drinks about .6 ounces of alcohol per week. He reports that he does not use illicit drugs.   There is no immunization history on file for this patient.  Allergies  Allergen Reactions  . Zolpidem Tartrate     hallucinations    Medications: Patient's Medications  New Prescriptions   No medications on file  Previous Medications   ACETAMINOPHEN (TYLENOL) 325 MG TABLET    Take 650 mg by mouth every 6 (six) hours as needed for mild pain.   DONEPEZIL (ARICEPT) 5 MG TABLET    Take 0.5 tablets (2.5 mg total) by mouth at bedtime.   FEEDING SUPPLEMENT, ENSURE, (ENSURE) PUDG    Take 1 Container by mouth 3 (three) times daily between meals.   POTASSIUM CHLORIDE SA (K-DUR,KLOR-CON) 20 MEQ TABLET    Take 1 tablet (20 mEq total) by mouth daily for 2 weeks.   SERTRALINE (ZOLOFT) 50 MG TABLET    Take 0.5 tablets (25 mg total) by mouth daily.  Modified Medications   No medications on file  Discontinued Medications   No medications on file     Review of Systems  Constitutional: Positive for activity change and fatigue. Negative for fever, appetite change and unexpected weight change.  HENT: Positive for hearing loss (Bilateral hearing AIDS). Negative for congestion, ear pain, rhinorrhea, sore throat, tinnitus, trouble swallowing and voice change.   Eyes: Positive for visual disturbance (Corrective lenses).       Corrective lenses  Respiratory: Negative for cough, choking, chest tightness, shortness of breath and wheezing.   Cardiovascular: Negative for chest pain, palpitations and leg swelling.  Gastrointestinal: Negative for nausea, abdominal pain, diarrhea, constipation and abdominal distention.       Prior prior history of GI bleed and GERD.  Endocrine: Negative for  cold intolerance, heat intolerance, polydipsia, polyphagia and polyuria.  Genitourinary: Negative for dysuria, urgency, frequency and testicular pain.       Not incontinent  Musculoskeletal: Positive for gait problem. Negative for arthralgias, back pain, myalgias and neck pain.  Skin: Positive for wound. Negative for color change, pallor and rash.  Allergic/Immunologic: Negative.   Neurological: Negative for dizziness, tremors, syncope, speech difficulty, weakness, numbness and headaches.       Cerebellar atrophy and small vessel disease of the cerebrum  Hematological: Negative for adenopathy. Does not bruise/bleed easily.  Psychiatric/Behavioral: Positive for behavioral problems, confusion, sleep disturbance, decreased concentration and agitation. Negative for hallucinations. The patient is nervous/anxious and is hyperactive.  Filed Vitals:   05/18/14 1252  BP: 158/98  Pulse: 96  Temp: 98.2 F (36.8 C)  Resp: 20  Height: $Remove'5\' 9"'[id number]$  (1.753 m)  Weight: 149 lb (67.586 kg)  SpO2: 96%   Body mass index is 21.99 kg/(m^2).  Physical Exam  Constitutional:  Frail. Poorly nourished. Cachectic appearing.  HENT:  Severe hearing loss bilaterally  Eyes: Conjunctivae and EOM are normal. Pupils are equal, round, and reactive to light.  Neck: No JVD present. No tracheal deviation present. No thyromegaly present.  Cardiovascular: Intact distal pulses.  Exam reveals no gallop and no friction rub.   No murmur heard. Tachycardia  Pulmonary/Chest: No respiratory distress. He has no wheezes. He has no rales. He exhibits no tenderness.  Abdominal: He exhibits no distension and no mass. There is no tenderness. There is no guarding.  Genitourinary:  Condom catheter in place. Urine is cloudy and deep orange in color.  Musculoskeletal: Normal range of motion. He exhibits no edema and no tenderness.  Lymphadenopathy:    He has no cervical adenopathy.  Neurological: He is alert. No cranial nerve deficit.  Coordination normal.  Disoriented. Fidgety. Anxious.  Skin:  Arms are bruised. There is a skin tear on the right arm.  Psychiatric:  Anxious. Sad. Disoriented.     Labs reviewed: Admission on 05/14/2014, Discharged on 05/16/2014  Component Date Value Ref Range Status  . Specimen Description 05/14/2014 URINE, CLEAN CATCH   Final  . Special Requests 05/14/2014 NONE   Final  . Culture  Setup Time 05/14/2014    Final                   Value:05/15/2014 01:47                         Performed at [facility]  . Colony Count 05/14/2014    Final                   Value:50,000 COLONIES/ML                         Performed at [facility]  . Culture 05/14/2014    Final                   Value:Multiple bacterial morphotypes present, none predominant. Suggest appropriate recollection if clinically indicated.                         Performed at [facility]  . Report Status 05/14/2014 05/15/2014 FINAL   Final  . Color, Urine 05/14/2014 YELLOW  YELLOW Final  . APPearance 05/14/2014 CLEAR  CLEAR Final  . Specific Gravity, Urine 05/14/2014 1.009  1.005 - 1.030 Final  . pH 05/14/2014 6.5  5.0 - 8.0 Final  . Glucose, UA 05/14/2014 NEGATIVE  NEGATIVE mg/dL Final  . Hgb urine dipstick 05/14/2014 TRACE* NEGATIVE Final  . Bilirubin Urine 05/14/2014 NEGATIVE  NEGATIVE Final  . Ketones, ur 05/14/2014 NEGATIVE  NEGATIVE mg/dL Final  . Protein, ur 05/14/2014 NEGATIVE  NEGATIVE mg/dL Final  . Urobilinogen, UA 05/14/2014 1.0  0.0 - 1.0 mg/dL Final  . Nitrite 05/14/2014 NEGATIVE  NEGATIVE Final  . Leukocytes, UA 05/14/2014 NEGATIVE  NEGATIVE Final  . Fecal Occult Bld 05/14/2014 NEGATIVE  NEGATIVE Final  . Campylobacter by PCR 05/15/2014 Negative   Final  . C difficile toxin A/B 05/15/2014 Negative   Final  .  E coli 0157 by PCR 05/15/2014 Negative   Final  . E coli (ETEC) LT/ST 05/15/2014 Negative   Final  . E coli (STEC) 05/15/2014 Negative   Final  . Salmonella by PCR  05/15/2014 Negative   Final  . Shigella by PCR 05/15/2014 Negative   Final  . Norovirus G!/G2 05/15/2014 Negative   Final  . Rotavirus A by PCR 05/15/2014 Negative   Final  . G lamblia by PCR 05/15/2014 Negative   Final  . Cryptosporidium by PCR 05/15/2014 Negative   Final   Comment: (NOTE)                           ** Normal Reference Range for each Analyte: Not Detected **                                The detection and identification of specific gastrointestinal                          microbial nucleic acid from individuals exhibiting signs and                          symptoms of gastrointestinal infection aids in the diagnosis                          of gastrointestinal infection when used in conjunction with                          clinical evaluation, laboratory findings, and epidemiological                          information.                          ** The xTAG Gastrointestinal Pathogen Panel results are presumptive                          and must be confirmed by FDA-cleared tests or other acceptable                          reference methods.  The results of this test should not be used as                          the sole basis for diagnosis, treatment, or other patient management                          decisions.                          Performed using the [employer] xTAG Gastrointestinal Pathogen Panel test                          kit.                          Performed at [facility]  . C difficile by pcr 05/15/2014 NEGATIVE  NEGATIVE Final  . WBC 05/14/2014  6.5  4.0 - 10.5 K/uL Final  . RBC 05/14/2014 4.50  4.22 - 5.81 MIL/uL Final  . Hemoglobin 05/14/2014 15.3  13.0 - 17.0 g/dL Final  . HCT 05/14/2014 42.6  39.0 - 52.0 % Final  . MCV 05/14/2014 94.7  78.0 - 100.0 fL Final  . MCH 05/14/2014 34.0  26.0 - 34.0 pg Final  . MCHC 05/14/2014 35.9  30.0 - 36.0 g/dL Final  . RDW 05/14/2014 13.1  11.5 - 15.5 % Final  . Platelets 05/14/2014 111* 150 - 400 K/uL  Final   Comment: REPEATED TO VERIFY                          SPECIMEN CHECKED FOR CLOTS                          PLATELET COUNT CONFIRMED BY SMEAR  . Neutrophils Relative % 05/14/2014 66  43 - 77 % Final  . Neutro Abs 05/14/2014 4.3  1.7 - 7.7 K/uL Final  . Lymphocytes Relative 05/14/2014 20  12 - 46 % Final  . Lymphs Abs 05/14/2014 1.3  0.7 - 4.0 K/uL Final  . Monocytes Relative 05/14/2014 14* 3 - 12 % Final  . Monocytes Absolute 05/14/2014 0.9  0.1 - 1.0 K/uL Final  . Eosinophils Relative 05/14/2014 1  0 - 5 % Final  . Eosinophils Absolute 05/14/2014 0.0  0.0 - 0.7 K/uL Final  . Basophils Relative 05/14/2014 0  0 - 1 % Final  . Basophils Absolute 05/14/2014 0.0  0.0 - 0.1 K/uL Final  . Sodium 05/14/2014 131* 137 - 147 mEq/L Final  . Potassium 05/14/2014 3.9  3.7 - 5.3 mEq/L Final   HEMOLYSIS AT THIS LEVEL MAY AFFECT RESULT  . Chloride 05/14/2014 94* 96 - 112 mEq/L Final  . CO2 05/14/2014 23  19 - 32 mEq/L Final  . Glucose, Bld 05/14/2014 99  70 - 99 mg/dL Final  . BUN 05/14/2014 14  6 - 23 mg/dL Final  . Creatinine, Ser 05/14/2014 0.73  0.50 - 1.35 mg/dL Final  . Calcium 05/14/2014 9.5  8.4 - 10.5 mg/dL Final  . Total Protein 05/14/2014 6.7  6.0 - 8.3 g/dL Final  . Albumin 05/14/2014 3.4* 3.5 - 5.2 g/dL Final  . AST 05/14/2014 92* 0 - 37 U/L Final   HEMOLYSIS AT THIS LEVEL MAY AFFECT RESULT  . ALT 05/14/2014 36  0 - 53 U/L Final   HEMOLYSIS AT THIS LEVEL MAY AFFECT RESULT  . Alkaline Phosphatase 05/14/2014 108  39 - 117 U/L Final  . Total Bilirubin 05/14/2014 2.0* 0.3 - 1.2 mg/dL Final  . GFR calc non Af Amer 05/14/2014 78* >90 mL/min Final  . GFR calc Af Amer 05/14/2014 90* >90 mL/min Final   Comment: (NOTE)                          The eGFR has been calculated using the CKD EPI equation.                          This calculation has not been validated in all clinical situations.                          eGFR's persistently <90 mL/min signify possible Chronic Kidney  Disease.  . Anion gap 05/14/2014 14  5 - 15 Final  . Lipase 05/14/2014 59  11 - 59 U/L Final  . Troponin I 05/14/2014 <0.30  <0.30 ng/mL Final   Comment:                                 Due to the release kinetics of cTnI,                          a negative result within the first hours                          of the onset of symptoms does not rule out                          myocardial infarction with certainty.                          If myocardial infarction is still suspected,                          repeat the test at appropriate intervals.  . Lactic Acid, Venous 05/14/2014 2.6* 0.5 - 2.2 mmol/L Final  . Squamous Epithelial / LPF 05/14/2014 RARE  RARE Final  . WBC, UA 05/14/2014 0-2  <3 WBC/hpf Final  . RBC / HPF 05/14/2014 3-6  <3 RBC/hpf Final  . Bacteria, UA 05/14/2014 RARE  RARE Final  . Ammonia 05/14/2014 40  11 - 60 umol/L Final  . Sodium 05/15/2014 136* 137 - 147 mEq/L Final  . Potassium 05/15/2014 3.5* 3.7 - 5.3 mEq/L Final  . Chloride 05/15/2014 101  96 - 112 mEq/L Final  . CO2 05/15/2014 25  19 - 32 mEq/L Final  . Glucose, Bld 05/15/2014 88  70 - 99 mg/dL Final  . BUN 05/15/2014 12  6 - 23 mg/dL Final  . Creatinine, Ser 05/15/2014 0.67  0.50 - 1.35 mg/dL Final  . Calcium 05/15/2014 8.5  8.4 - 10.5 mg/dL Final  . Total Protein 05/15/2014 5.3* 6.0 - 8.3 g/dL Final  . Albumin 05/15/2014 2.8* 3.5 - 5.2 g/dL Final  . AST 05/15/2014 83* 0 - 37 U/L Final  . ALT 05/15/2014 30  0 - 53 U/L Final  . Alkaline Phosphatase 05/15/2014 88  39 - 117 U/L Final  . Total Bilirubin 05/15/2014 1.9* 0.3 - 1.2 mg/dL Final  . GFR calc non Af Amer 05/15/2014 80* >90 mL/min Final  . GFR calc Af Amer 05/15/2014 >90  >90 mL/min Final   Comment: (NOTE)                          The eGFR has been calculated using the CKD EPI equation.                          This calculation has not been validated in all clinical situations.                          eGFR's persistently <90  mL/min signify possible Chronic Kidney  Disease.  . Anion gap 05/15/2014 10  5 - 15 Final  . WBC 05/15/2014 4.7  4.0 - 10.5 K/uL Final  . RBC 05/15/2014 4.12* 4.22 - 5.81 MIL/uL Final  . Hemoglobin 05/15/2014 13.6  13.0 - 17.0 g/dL Final  . HCT 05/15/2014 38.7* 39.0 - 52.0 % Final  . MCV 05/15/2014 93.9  78.0 - 100.0 fL Final  . MCH 05/15/2014 33.0  26.0 - 34.0 pg Final  . MCHC 05/15/2014 35.1  30.0 - 36.0 g/dL Final  . RDW 05/15/2014 13.3  11.5 - 15.5 % Final  . Platelets 05/15/2014 98* 150 - 400 K/uL Final   CONSISTENT WITH PREVIOUS RESULT  . Vitamin B-12 05/15/2014 488  211 - 911 pg/mL Final   Performed at [facility]  . TSH 05/15/2014 2.860  0.350 - 4.500 uIU/mL Final  . MRSA by PCR 05/15/2014 NEGATIVE  NEGATIVE Final   Comment:                                 The GeneXpert MRSA Assay (FDA                          approved for NASAL specimens                          only), is one component of a                          comprehensive MRSA colonization                          surveillance program. It is not                          intended to diagnose MRSA                          infection nor to guide or                          monitor treatment for                          MRSA infections.    Ct Head Wo Contrast  05/14/2014   CLINICAL DATA:  Progressive dementia and worsening recent confusion. Increasing lethargy. Has fallen 7 times in the past 10 days, with diffuse weakness and difficulty getting in and out of bed. Initial encounter.  EXAM: CT HEAD WITHOUT CONTRAST  TECHNIQUE: Contiguous axial images were obtained from the base of the skull through the vertex without intravenous contrast.  COMPARISON:  CT of the head performed 12/24/2006  FINDINGS: There is no evidence of acute infarction, mass lesion, or intra- or extra-axial hemorrhage on CT.  Prominence of the ventricles and sulci reflects moderate age-appropriate cortical volume loss.  Cerebellar atrophy is noted. Scattered periventricular white matter change likely reflects small vessel ischemic microangiopathy.  The brainstem and fourth ventricle are within normal limits. The basal ganglia are unremarkable in appearance. The cerebral hemispheres demonstrate grossly normal gray-white differentiation. No mass effect or midline shift is seen.  There is no evidence of fracture; visualized osseous structures are unremarkable in appearance. The orbits  are within normal limits. Mucosal thickening is noted at the maxillary sinuses, more prominent on the left. The remaining paranasal sinuses and mastoid air cells are well-aerated. No significant soft tissue abnormalities are seen.  IMPRESSION: 1. No acute intracranial pathology seen on CT. 2. Moderate age-appropriate cortical volume loss and scattered small vessel ischemic microangiopathy. 3. Mucosal thickening at the maxillary sinuses.   Electronically Signed   By: Garald Balding M.D.   On: 05/14/2014 23:15   Dg Abd Acute W/chest  05/14/2014   CLINICAL DATA:  Fall, weakness  EXAM: ACUTE ABDOMEN SERIES (ABDOMEN 2 VIEW & CHEST 1 VIEW)  COMPARISON:  05/12/2011 chest x-ray  FINDINGS: Prominent aortic arch compatible with thoracic aortic aneurysm, similar to the prior study. Mild cardiac enlargement without heart failure  Streaky lung markings in the bases have progressed likely related to atelectasis. There may be underlying scarring in the lung bases  Lumbar levoscoliosis.  No acute bony abnormality.  Negative for bowel obstruction. No air-fluid level. No free air identified.  IMPRESSION: Bibasilar atelectasis  Thoracic aortic aneurysm  Nonobstructive bowel gas pattern.   Electronically Signed   By: Franchot Gallo M.D.   On: 05/14/2014 16:58     Assessment/Plan  Overall, his condition is poor and prognosis is guarded.  1. Alzheimer's disease Continue donepezil  2. History of fall Confusion and gait instability. Cerebellar atrophy. High risk  for additional falls.  3. Hyponatremia -CMP next week  4. BPH (benign prostatic hyperplasia) Cath remains in place due to incontinence  5. Urinary incontinence, unspecified incontinence type Condom catheter Urinalysis and culture  6. Malnutrition Encourage and supervise eating  7. Hypokalemia CMP next week  8. Delirium Continue to observe closely  9. Thrombocytopenia -CBC next week with platelet count   05/20/14  Patient's family has called and wanted him put on comfort measures only. We have discontinued glucosamine, Zoloft, and KCl. The patient is not to be transferred to the hospital. He is no CODE BLUE/DO NOT RESUSCITATE.

## 2014-06-13 ENCOUNTER — Encounter: Payer: Self-pay | Admitting: Internal Medicine

## 2014-06-28 ENCOUNTER — Encounter: Payer: Self-pay | Admitting: Internal Medicine

## 2014-08-09 DEATH — deceased

## 2015-07-22 NOTE — Progress Notes (Signed)
This encounter was created in error - please disregard.
